# Patient Record
Sex: Male | Born: 1940 | Race: White | Hispanic: No | Marital: Married | Smoking: Former smoker
Health system: Southern US, Community
[De-identification: ages and names within clinical notes are randomized; demographics above are authoritative.]

## PROBLEM LIST (undated history)

## (undated) DIAGNOSIS — D72829 Elevated white blood cell count, unspecified: Secondary | ICD-10-CM

## (undated) DIAGNOSIS — M5430 Sciatica, unspecified side: Secondary | ICD-10-CM

## (undated) DIAGNOSIS — H348392 Tributary (branch) retinal vein occlusion, unspecified eye, stable: Secondary | ICD-10-CM

## (undated) DIAGNOSIS — Z860100 Personal history of colon polyps, unspecified: Secondary | ICD-10-CM

## (undated) DIAGNOSIS — C61 Malignant neoplasm of prostate: Secondary | ICD-10-CM

## (undated) DIAGNOSIS — J449 Chronic obstructive pulmonary disease, unspecified: Secondary | ICD-10-CM

## (undated) DIAGNOSIS — I219 Acute myocardial infarction, unspecified: Secondary | ICD-10-CM

## (undated) DIAGNOSIS — H40059 Ocular hypertension, unspecified eye: Secondary | ICD-10-CM

## (undated) DIAGNOSIS — I10 Essential (primary) hypertension: Secondary | ICD-10-CM

## (undated) DIAGNOSIS — K219 Gastro-esophageal reflux disease without esophagitis: Secondary | ICD-10-CM

## (undated) DIAGNOSIS — M48061 Spinal stenosis, lumbar region without neurogenic claudication: Secondary | ICD-10-CM

## (undated) DIAGNOSIS — S37009A Unspecified injury of unspecified kidney, initial encounter: Secondary | ICD-10-CM

## (undated) DIAGNOSIS — M79661 Pain in right lower leg: Secondary | ICD-10-CM

## (undated) DIAGNOSIS — R911 Solitary pulmonary nodule: Secondary | ICD-10-CM

## (undated) DIAGNOSIS — I2510 Atherosclerotic heart disease of native coronary artery without angina pectoris: Secondary | ICD-10-CM

## (undated) DIAGNOSIS — G2581 Restless legs syndrome: Secondary | ICD-10-CM

## (undated) DIAGNOSIS — E785 Hyperlipidemia, unspecified: Secondary | ICD-10-CM

## (undated) DIAGNOSIS — N419 Inflammatory disease of prostate, unspecified: Secondary | ICD-10-CM

## (undated) HISTORY — PX: COLONOSCOPY W/ BIOPSIES AND POLYPECTOMY: SHX1376

## (undated) HISTORY — PX: PROSTATE BIOPSY: SHX241

## (undated) HISTORY — DX: Personal history of colon polyps, unspecified: Z86.0100

## (undated) HISTORY — DX: Pain in right lower leg: M79.661

## (undated) HISTORY — DX: Restless legs syndrome: G25.81

## (undated) HISTORY — DX: Sciatica, unspecified side: M54.30

## (undated) HISTORY — DX: Tributary (branch) retinal vein occlusion, unspecified eye, stable: H34.8392

## (undated) HISTORY — DX: Elevated white blood cell count, unspecified: D72.829

## (undated) HISTORY — PX: TONSILLECTOMY: SUR1361

## (undated) HISTORY — DX: Malignant neoplasm of prostate: C61

## (undated) HISTORY — DX: Gastro-esophageal reflux disease without esophagitis: K21.9

## (undated) HISTORY — DX: Inflammatory disease of prostate, unspecified: N41.9

## (undated) HISTORY — DX: Spinal stenosis, lumbar region without neurogenic claudication: M48.061

## (undated) HISTORY — DX: Ocular hypertension, unspecified eye: H40.059

## (undated) HISTORY — PX: COLONOSCOPY: SHX174

## (undated) HISTORY — DX: Hyperlipidemia, unspecified: E78.5

## (undated) HISTORY — DX: Unspecified injury of unspecified kidney, initial encounter: S37.009A

## (undated) HISTORY — DX: Personal history of colonic polyps: Z86.010

## (undated) HISTORY — DX: Solitary pulmonary nodule: R91.1

---

## 1999-02-08 ENCOUNTER — Emergency Department (HOSPITAL_COMMUNITY): Admission: EM | Admit: 1999-02-08 | Discharge: 1999-02-08 | Payer: Self-pay | Admitting: *Deleted

## 2000-06-26 ENCOUNTER — Ambulatory Visit (HOSPITAL_BASED_OUTPATIENT_CLINIC_OR_DEPARTMENT_OTHER): Admission: RE | Admit: 2000-06-26 | Discharge: 2000-06-26 | Payer: Self-pay | Admitting: Internal Medicine

## 2000-08-15 ENCOUNTER — Inpatient Hospital Stay (HOSPITAL_COMMUNITY): Admission: EM | Admit: 2000-08-15 | Discharge: 2000-08-16 | Payer: Self-pay | Admitting: Emergency Medicine

## 2000-08-15 ENCOUNTER — Encounter: Payer: Self-pay | Admitting: Emergency Medicine

## 2000-08-16 ENCOUNTER — Encounter: Payer: Self-pay | Admitting: Internal Medicine

## 2002-10-26 ENCOUNTER — Ambulatory Visit (HOSPITAL_COMMUNITY): Admission: RE | Admit: 2002-10-26 | Discharge: 2002-10-26 | Payer: Self-pay | Admitting: Gastroenterology

## 2003-04-25 HISTORY — PX: PROSTATECTOMY: SHX69

## 2003-05-14 ENCOUNTER — Inpatient Hospital Stay (HOSPITAL_COMMUNITY): Admission: RE | Admit: 2003-05-14 | Discharge: 2003-05-17 | Payer: Self-pay | Admitting: Urology

## 2003-09-25 HISTORY — PX: INGUINAL HERNIA REPAIR: SUR1180

## 2003-10-18 ENCOUNTER — Ambulatory Visit (HOSPITAL_BASED_OUTPATIENT_CLINIC_OR_DEPARTMENT_OTHER): Admission: RE | Admit: 2003-10-18 | Discharge: 2003-10-18 | Payer: Self-pay | Admitting: Surgery

## 2003-10-18 ENCOUNTER — Ambulatory Visit (HOSPITAL_COMMUNITY): Admission: RE | Admit: 2003-10-18 | Discharge: 2003-10-18 | Payer: Self-pay | Admitting: Surgery

## 2007-08-25 DIAGNOSIS — H348392 Tributary (branch) retinal vein occlusion, unspecified eye, stable: Secondary | ICD-10-CM

## 2007-08-25 HISTORY — PX: RETINAL LASER PROCEDURE: SHX2339

## 2007-08-25 HISTORY — DX: Tributary (branch) retinal vein occlusion, unspecified eye, stable: H34.8392

## 2011-10-20 DIAGNOSIS — H35379 Puckering of macula, unspecified eye: Secondary | ICD-10-CM | POA: Diagnosis not present

## 2011-10-20 DIAGNOSIS — H40019 Open angle with borderline findings, low risk, unspecified eye: Secondary | ICD-10-CM | POA: Diagnosis not present

## 2011-10-20 DIAGNOSIS — H2510 Age-related nuclear cataract, unspecified eye: Secondary | ICD-10-CM | POA: Diagnosis not present

## 2012-04-19 DIAGNOSIS — H349 Unspecified retinal vascular occlusion: Secondary | ICD-10-CM | POA: Diagnosis not present

## 2012-04-19 DIAGNOSIS — H52209 Unspecified astigmatism, unspecified eye: Secondary | ICD-10-CM | POA: Diagnosis not present

## 2012-04-19 DIAGNOSIS — H2510 Age-related nuclear cataract, unspecified eye: Secondary | ICD-10-CM | POA: Diagnosis not present

## 2012-04-19 DIAGNOSIS — H40019 Open angle with borderline findings, low risk, unspecified eye: Secondary | ICD-10-CM | POA: Diagnosis not present

## 2012-04-20 DIAGNOSIS — G4700 Insomnia, unspecified: Secondary | ICD-10-CM | POA: Diagnosis not present

## 2012-04-20 DIAGNOSIS — E785 Hyperlipidemia, unspecified: Secondary | ICD-10-CM | POA: Diagnosis not present

## 2012-04-20 DIAGNOSIS — Z131 Encounter for screening for diabetes mellitus: Secondary | ICD-10-CM | POA: Diagnosis not present

## 2012-04-20 DIAGNOSIS — F17200 Nicotine dependence, unspecified, uncomplicated: Secondary | ICD-10-CM | POA: Diagnosis not present

## 2012-04-20 DIAGNOSIS — K219 Gastro-esophageal reflux disease without esophagitis: Secondary | ICD-10-CM | POA: Diagnosis not present

## 2012-04-20 DIAGNOSIS — Z8546 Personal history of malignant neoplasm of prostate: Secondary | ICD-10-CM | POA: Diagnosis not present

## 2012-04-20 DIAGNOSIS — Z1331 Encounter for screening for depression: Secondary | ICD-10-CM | POA: Diagnosis not present

## 2012-06-09 DIAGNOSIS — Z23 Encounter for immunization: Secondary | ICD-10-CM | POA: Diagnosis not present

## 2012-08-24 HISTORY — PX: CATARACT EXTRACTION W/ INTRAOCULAR LENS  IMPLANT, BILATERAL: SHX1307

## 2012-10-24 DIAGNOSIS — H2510 Age-related nuclear cataract, unspecified eye: Secondary | ICD-10-CM | POA: Diagnosis not present

## 2012-10-24 DIAGNOSIS — H40019 Open angle with borderline findings, low risk, unspecified eye: Secondary | ICD-10-CM | POA: Diagnosis not present

## 2012-10-24 DIAGNOSIS — H35379 Puckering of macula, unspecified eye: Secondary | ICD-10-CM | POA: Diagnosis not present

## 2012-10-24 DIAGNOSIS — H349 Unspecified retinal vascular occlusion: Secondary | ICD-10-CM | POA: Diagnosis not present

## 2012-11-01 DIAGNOSIS — H2510 Age-related nuclear cataract, unspecified eye: Secondary | ICD-10-CM | POA: Diagnosis not present

## 2012-11-01 DIAGNOSIS — H40019 Open angle with borderline findings, low risk, unspecified eye: Secondary | ICD-10-CM | POA: Diagnosis not present

## 2012-12-06 DIAGNOSIS — H40019 Open angle with borderline findings, low risk, unspecified eye: Secondary | ICD-10-CM | POA: Diagnosis not present

## 2012-12-29 DIAGNOSIS — H35379 Puckering of macula, unspecified eye: Secondary | ICD-10-CM | POA: Diagnosis not present

## 2012-12-29 DIAGNOSIS — H2510 Age-related nuclear cataract, unspecified eye: Secondary | ICD-10-CM | POA: Diagnosis not present

## 2012-12-29 DIAGNOSIS — H25019 Cortical age-related cataract, unspecified eye: Secondary | ICD-10-CM | POA: Diagnosis not present

## 2013-01-04 DIAGNOSIS — H2589 Other age-related cataract: Secondary | ICD-10-CM | POA: Diagnosis not present

## 2013-01-04 DIAGNOSIS — Z8669 Personal history of other diseases of the nervous system and sense organs: Secondary | ICD-10-CM | POA: Diagnosis not present

## 2013-01-04 DIAGNOSIS — H25019 Cortical age-related cataract, unspecified eye: Secondary | ICD-10-CM | POA: Diagnosis not present

## 2013-01-04 DIAGNOSIS — H2510 Age-related nuclear cataract, unspecified eye: Secondary | ICD-10-CM | POA: Diagnosis not present

## 2013-01-18 DIAGNOSIS — H25019 Cortical age-related cataract, unspecified eye: Secondary | ICD-10-CM | POA: Diagnosis not present

## 2013-01-18 DIAGNOSIS — H2589 Other age-related cataract: Secondary | ICD-10-CM | POA: Diagnosis not present

## 2013-01-18 DIAGNOSIS — H2510 Age-related nuclear cataract, unspecified eye: Secondary | ICD-10-CM | POA: Diagnosis not present

## 2013-05-16 DIAGNOSIS — Z1331 Encounter for screening for depression: Secondary | ICD-10-CM | POA: Diagnosis not present

## 2013-05-16 DIAGNOSIS — Z136 Encounter for screening for cardiovascular disorders: Secondary | ICD-10-CM | POA: Diagnosis not present

## 2013-05-16 DIAGNOSIS — G4700 Insomnia, unspecified: Secondary | ICD-10-CM | POA: Diagnosis not present

## 2013-05-16 DIAGNOSIS — Z8546 Personal history of malignant neoplasm of prostate: Secondary | ICD-10-CM | POA: Diagnosis not present

## 2013-05-16 DIAGNOSIS — Z0000 Encounter for general adult medical examination without abnormal findings: Secondary | ICD-10-CM | POA: Diagnosis not present

## 2013-06-09 DIAGNOSIS — E785 Hyperlipidemia, unspecified: Secondary | ICD-10-CM | POA: Diagnosis not present

## 2013-06-09 DIAGNOSIS — Z23 Encounter for immunization: Secondary | ICD-10-CM | POA: Diagnosis not present

## 2013-06-09 DIAGNOSIS — F17200 Nicotine dependence, unspecified, uncomplicated: Secondary | ICD-10-CM | POA: Diagnosis not present

## 2013-06-13 DIAGNOSIS — H40019 Open angle with borderline findings, low risk, unspecified eye: Secondary | ICD-10-CM | POA: Diagnosis not present

## 2013-08-29 DIAGNOSIS — E785 Hyperlipidemia, unspecified: Secondary | ICD-10-CM | POA: Diagnosis not present

## 2013-09-08 DIAGNOSIS — M5430 Sciatica, unspecified side: Secondary | ICD-10-CM | POA: Diagnosis not present

## 2013-11-07 DIAGNOSIS — H40019 Open angle with borderline findings, low risk, unspecified eye: Secondary | ICD-10-CM | POA: Diagnosis not present

## 2014-03-09 DIAGNOSIS — H40019 Open angle with borderline findings, low risk, unspecified eye: Secondary | ICD-10-CM | POA: Diagnosis not present

## 2014-05-17 DIAGNOSIS — E785 Hyperlipidemia, unspecified: Secondary | ICD-10-CM | POA: Diagnosis not present

## 2014-05-17 DIAGNOSIS — Z8546 Personal history of malignant neoplasm of prostate: Secondary | ICD-10-CM | POA: Diagnosis not present

## 2014-05-17 DIAGNOSIS — Z1331 Encounter for screening for depression: Secondary | ICD-10-CM | POA: Diagnosis not present

## 2014-05-17 DIAGNOSIS — Z23 Encounter for immunization: Secondary | ICD-10-CM | POA: Diagnosis not present

## 2014-05-17 DIAGNOSIS — Z0000 Encounter for general adult medical examination without abnormal findings: Secondary | ICD-10-CM | POA: Diagnosis not present

## 2014-08-30 DIAGNOSIS — M5431 Sciatica, right side: Secondary | ICD-10-CM | POA: Diagnosis not present

## 2014-08-30 DIAGNOSIS — J069 Acute upper respiratory infection, unspecified: Secondary | ICD-10-CM | POA: Diagnosis not present

## 2014-10-15 DIAGNOSIS — H349 Unspecified retinal vascular occlusion: Secondary | ICD-10-CM | POA: Diagnosis not present

## 2014-10-15 DIAGNOSIS — Z961 Presence of intraocular lens: Secondary | ICD-10-CM | POA: Diagnosis not present

## 2014-10-15 DIAGNOSIS — H35372 Puckering of macula, left eye: Secondary | ICD-10-CM | POA: Diagnosis not present

## 2014-10-15 DIAGNOSIS — H40013 Open angle with borderline findings, low risk, bilateral: Secondary | ICD-10-CM | POA: Diagnosis not present

## 2015-01-10 ENCOUNTER — Other Ambulatory Visit: Payer: Self-pay | Admitting: Gastroenterology

## 2015-01-15 ENCOUNTER — Encounter: Payer: Self-pay | Admitting: *Deleted

## 2015-04-02 ENCOUNTER — Encounter (HOSPITAL_COMMUNITY): Payer: Self-pay | Admitting: *Deleted

## 2015-04-08 ENCOUNTER — Ambulatory Visit (HOSPITAL_COMMUNITY)
Admission: RE | Admit: 2015-04-08 | Discharge: 2015-04-08 | Disposition: A | Discharge status: Home / Self Care | Payer: Medicare Other | Source: Ambulatory Visit | Attending: Gastroenterology | Admitting: Gastroenterology

## 2015-04-08 ENCOUNTER — Ambulatory Visit (HOSPITAL_COMMUNITY): Payer: Medicare Other | Admitting: Registered Nurse

## 2015-04-08 ENCOUNTER — Encounter (HOSPITAL_COMMUNITY): Payer: Self-pay

## 2015-04-08 ENCOUNTER — Encounter (HOSPITAL_COMMUNITY)
Admission: RE | Disposition: A | Discharge status: Home / Self Care | Payer: Self-pay | Source: Ambulatory Visit | Attending: Gastroenterology

## 2015-04-08 DIAGNOSIS — K5790 Diverticulosis of intestine, part unspecified, without perforation or abscess without bleeding: Secondary | ICD-10-CM | POA: Diagnosis not present

## 2015-04-08 DIAGNOSIS — K5730 Diverticulosis of large intestine without perforation or abscess without bleeding: Secondary | ICD-10-CM | POA: Diagnosis not present

## 2015-04-08 DIAGNOSIS — Z1211 Encounter for screening for malignant neoplasm of colon: Secondary | ICD-10-CM | POA: Diagnosis not present

## 2015-04-08 DIAGNOSIS — E780 Pure hypercholesterolemia: Secondary | ICD-10-CM | POA: Insufficient documentation

## 2015-04-08 DIAGNOSIS — Z86010 Personal history of colonic polyps: Secondary | ICD-10-CM | POA: Insufficient documentation

## 2015-04-08 DIAGNOSIS — Z8546 Personal history of malignant neoplasm of prostate: Secondary | ICD-10-CM | POA: Insufficient documentation

## 2015-04-08 DIAGNOSIS — G2581 Restless legs syndrome: Secondary | ICD-10-CM | POA: Diagnosis not present

## 2015-04-08 DIAGNOSIS — F17200 Nicotine dependence, unspecified, uncomplicated: Secondary | ICD-10-CM | POA: Insufficient documentation

## 2015-04-08 DIAGNOSIS — K219 Gastro-esophageal reflux disease without esophagitis: Secondary | ICD-10-CM | POA: Diagnosis not present

## 2015-04-08 HISTORY — PX: COLONOSCOPY WITH PROPOFOL: SHX5780

## 2015-04-08 SURGERY — COLONOSCOPY WITH PROPOFOL
Anesthesia: Monitor Anesthesia Care

## 2015-04-08 MED ORDER — PROPOFOL INFUSION 10 MG/ML OPTIME
INTRAVENOUS | Status: DC | PRN
  Administered 2015-04-08: 120 ug/kg/min via INTRAVENOUS

## 2015-04-08 MED ORDER — LIDOCAINE HCL (CARDIAC) 20 MG/ML IV SOLN
INTRAVENOUS | Status: DC | PRN
  Administered 2015-04-08: 100 mg via INTRAVENOUS

## 2015-04-08 MED ORDER — LACTATED RINGERS IV SOLN
INTRAVENOUS | Status: DC
  Administered 2015-04-08: 11:00:00 via INTRAVENOUS

## 2015-04-08 MED ORDER — LIDOCAINE HCL (CARDIAC) 20 MG/ML IV SOLN
INTRAVENOUS | Status: AC
  Filled 2015-04-08: qty 5

## 2015-04-08 MED ORDER — PROPOFOL 10 MG/ML IV BOLUS
INTRAVENOUS | Status: AC
  Filled 2015-04-08: qty 20

## 2015-04-08 MED ORDER — SODIUM CHLORIDE 0.9 % IV SOLN: INTRAVENOUS | Status: DC

## 2015-04-08 MED ORDER — PROPOFOL 10 MG/ML IV BOLUS
INTRAVENOUS | Status: DC | PRN
  Administered 2015-04-08: 40 mg via INTRAVENOUS
  Administered 2015-04-08 (×2): 30 mg via INTRAVENOUS

## 2015-04-08 SURGICAL SUPPLY — 21 items

## 2015-04-08 NOTE — Transfer of Care (Signed)
Immediate Anesthesia Transfer of Care Note  Patient: Jay Wilkins  Procedure(s) Performed: Procedure(s): COLONOSCOPY WITH PROPOFOL (N/A)  Patient Location: PACU and Endoscopy Unit  Anesthesia Type:MAC  Level of Consciousness: awake, alert , oriented and patient cooperative  Airway & Oxygen Therapy: Patient Spontanous Breathing and Patient connected to face mask oxygen  Post-op Assessment: Report given to RN, Post -op Vital signs reviewed and stable and Patient moving all extremities  Post vital signs: Reviewed and stable  Last Vitals:  Filed Vitals:   04/08/15 1023  BP: 180/96  Pulse: 78  Temp: 36.4 C  Resp: 14    Complications: No apparent anesthesia complications

## 2015-04-08 NOTE — Anesthesia Procedure Notes (Signed)
Procedure Name: MAC Date/Time: 04/08/2015 10:36 AM Performed by: Carleene Cooper A Pre-anesthesia Checklist: Patient identified, Timeout performed, Emergency Drugs available, Suction available and Patient being monitored Patient Re-evaluated:Patient Re-evaluated prior to inductionOxygen Delivery Method: Simple face mask Dental Injury: Teeth and Oropharynx as per pre-operative assessment

## 2015-04-08 NOTE — Anesthesia Postprocedure Evaluation (Signed)
  Anesthesia Post-op Note  Patient: Jay Wilkins  Procedure(s) Performed: Procedure(s) (LRB): COLONOSCOPY WITH PROPOFOL (N/A)  Patient Location: PACU  Anesthesia Type: MAC  Level of Consciousness: awake and alert   Airway and Oxygen Therapy: Patient Spontanous Breathing  Post-op Pain: mild  Post-op Assessment: Post-op Vital signs reviewed, Patient's Cardiovascular Status Stable, Respiratory Function Stable, Patent Airway and No signs of Nausea or vomiting  Last Vitals:  Filed Vitals:   04/08/15 1125  BP: 134/79  Pulse: 69  Temp:   Resp: 18    Post-op Vital Signs: stable   Complications: No apparent anesthesia complications

## 2015-04-08 NOTE — Anesthesia Preprocedure Evaluation (Addendum)
Anesthesia Evaluation  Patient identified by MRN, date of birth, ID band Patient awake    Reviewed: Allergy & Precautions, NPO status , Patient's Chart, lab work & pertinent test results  Airway Mallampati: II  TM Distance: >3 FB Neck ROM: Full    Dental no notable dental hx.    Pulmonary Current Smoker,  breath sounds clear to auscultation  Pulmonary exam normal       Cardiovascular negative cardio ROS Normal cardiovascular examRhythm:Regular Rate:Normal     Neuro/Psych negative neurological ROS  negative psych ROS   GI/Hepatic negative GI ROS, Neg liver ROS,   Endo/Other  negative endocrine ROS  Renal/GU negative Renal ROS  negative genitourinary   Musculoskeletal negative musculoskeletal ROS (+)   Abdominal   Peds negative pediatric ROS (+)  Hematology negative hematology ROS (+)   Anesthesia Other Findings   Reproductive/Obstetrics negative OB ROS                            Anesthesia Physical Anesthesia Plan  ASA: II  Anesthesia Plan: MAC   Post-op Pain Management:    Induction:   Airway Management Planned: Simple Face Mask and Natural Airway  Additional Equipment:   Intra-op Plan:   Post-operative Plan:   Informed Consent: I have reviewed the patients History and Physical, chart, labs and discussed the procedure including the risks, benefits and alternatives for the proposed anesthesia with the patient or authorized representative who has indicated his/her understanding and acceptance.   Dental advisory given  Plan Discussed with: CRNA  Anesthesia Plan Comments:        Anesthesia Quick Evaluation

## 2015-04-08 NOTE — H&P (Signed)
  Procedure: Surveillance colonoscopy. 1991 colonoscopy performed with removal of a 5 mm adenomatous colon polyp. Normal surveillance colonoscopy performed on 04/10/2008  History: The patient is a 74 year old male born [birth year]. He is scheduled to undergo a surveillance colonoscopy today.  Past medical history: Gastroesophageal reflux. Hypercholesterolemia. Prostate cancer. Prostatectomy. Restless leg syndrome. Tonsillectomy. Right inguinal hernia are clear. Cataract surgery.  Medication allergies: None  Exam: The patient is alert and lying comfortably on the endoscopy stretcher. Abdomen is soft and nontender to palpation. Lungs are clear to auscultation. Cardiac exam reveals a regular rhythm.  Plan: Proceed with surveillance colonoscopy

## 2015-04-08 NOTE — Op Note (Signed)
Procedure: Surveillance colonoscopy. Adenomatous colon polyps removed colonoscopically in the past  Endoscopist: Earle Gell  Premedication: Propofol administered by anesthesia  Procedure: The patient was placed in the left lateral decubitus position. Anal inspection and digital rectal exam were normal. The Pentax pediatric colonoscope was introduced into the rectum and advanced to the cecum. A normal-appearing appendiceal orifice and ileocecal valve were identified. Colonic preparation for the exam today was good. Withdrawal time was 11 minutes  Rectum. Normal. Retroflexed view of the distal rectum was normal  Sigmoid colon and descending colon. Left colonic diverticulosis  Splenic flexure. Normal  Transverse colon. Normal.  Hepatic flexure. Normal  Ascending colon. Normal  Cecum and ileocecal valve. Normal  Assessment: Normal surveillance colonoscopy

## 2015-04-08 NOTE — Discharge Instructions (Signed)
Colonoscopy, Care After °These instructions give you information on caring for yourself after your procedure. Your doctor may also give you more specific instructions. Call your doctor if you have any problems or questions after your procedure. °HOME CARE °· Do not drive for 24 hours. °· Do not sign important papers or use machinery for 24 hours. °· You may shower. °· You may go back to your usual activities, but go slower for the first 24 hours. °· Take rest breaks often during the first 24 hours. °· Walk around or use warm packs on your belly (abdomen) if you have belly cramping or gas. °· Drink enough fluids to keep your pee (urine) clear or pale yellow. °· Resume your normal diet. Avoid heavy or fried foods. °· Avoid drinking alcohol for 24 hours or as told by your doctor. °· Only take medicines as told by your doctor. °If a tissue sample (biopsy) was taken during the procedure:  °· Do not take aspirin or blood thinners for 7 days, or as told by your doctor. °· Do not drink alcohol for 7 days, or as told by your doctor. °· Eat soft foods for the first 24 hours. °GET HELP IF: °You still have a small amount of blood in your poop (stool) 2-3 days after the procedure. °GET HELP RIGHT AWAY IF: °· You have more than a small amount of blood in your poop. °· You see clumps of tissue (blood clots) in your poop. °· Your belly is puffy (swollen). °· You feel sick to your stomach (nauseous) or throw up (vomit). °· You have a fever. °· You have belly pain that gets worse and medicine does not help. °MAKE SURE YOU: °· Understand these instructions. °· Will watch your condition. °· Will get help right away if you are not doing well or get worse. °Document Released: 09/12/2010 Document Revised: 08/15/2013 Document Reviewed: 04/17/2013 °ExitCare® Patient Information ©2015 [employer]. This information is not intended to replace advice given to you by your health care provider. Make sure you discuss any questions you have with  your health care provider. ° °

## 2015-04-09 ENCOUNTER — Encounter (HOSPITAL_COMMUNITY): Payer: Self-pay | Admitting: Gastroenterology

## 2015-04-16 DIAGNOSIS — H40013 Open angle with borderline findings, low risk, bilateral: Secondary | ICD-10-CM | POA: Diagnosis not present

## 2015-04-16 DIAGNOSIS — H35372 Puckering of macula, left eye: Secondary | ICD-10-CM | POA: Diagnosis not present

## 2015-05-14 DIAGNOSIS — Z8546 Personal history of malignant neoplasm of prostate: Secondary | ICD-10-CM | POA: Diagnosis not present

## 2015-05-14 DIAGNOSIS — Z125 Encounter for screening for malignant neoplasm of prostate: Secondary | ICD-10-CM | POA: Diagnosis not present

## 2015-05-14 DIAGNOSIS — K219 Gastro-esophageal reflux disease without esophagitis: Secondary | ICD-10-CM | POA: Diagnosis not present

## 2015-05-14 DIAGNOSIS — E780 Pure hypercholesterolemia: Secondary | ICD-10-CM | POA: Diagnosis not present

## 2015-05-14 DIAGNOSIS — Z23 Encounter for immunization: Secondary | ICD-10-CM | POA: Diagnosis not present

## 2015-05-14 DIAGNOSIS — Z720 Tobacco use: Secondary | ICD-10-CM | POA: Diagnosis not present

## 2015-05-14 DIAGNOSIS — Z1389 Encounter for screening for other disorder: Secondary | ICD-10-CM | POA: Diagnosis not present

## 2015-05-28 DIAGNOSIS — H43813 Vitreous degeneration, bilateral: Secondary | ICD-10-CM | POA: Diagnosis not present

## 2015-05-28 DIAGNOSIS — H35373 Puckering of macula, bilateral: Secondary | ICD-10-CM | POA: Diagnosis not present

## 2015-05-28 DIAGNOSIS — H3581 Retinal edema: Secondary | ICD-10-CM | POA: Diagnosis not present

## 2015-05-28 DIAGNOSIS — H348322 Tributary (branch) retinal vein occlusion, left eye, stable: Secondary | ICD-10-CM | POA: Diagnosis not present

## 2015-07-23 ENCOUNTER — Other Ambulatory Visit: Payer: Self-pay | Admitting: Internal Medicine

## 2015-07-23 DIAGNOSIS — F17210 Nicotine dependence, cigarettes, uncomplicated: Secondary | ICD-10-CM

## 2015-07-23 DIAGNOSIS — R05 Cough: Secondary | ICD-10-CM | POA: Diagnosis not present

## 2015-07-23 DIAGNOSIS — Z139 Encounter for screening, unspecified: Secondary | ICD-10-CM

## 2015-08-01 ENCOUNTER — Ambulatory Visit
Admission: RE | Admit: 2015-08-01 | Discharge: 2015-08-01 | Disposition: A | Discharge status: Home / Self Care | Payer: Medicare Other | Source: Ambulatory Visit | Attending: Internal Medicine | Admitting: Internal Medicine

## 2015-08-01 DIAGNOSIS — Z87891 Personal history of nicotine dependence: Secondary | ICD-10-CM | POA: Diagnosis not present

## 2015-08-01 DIAGNOSIS — F17210 Nicotine dependence, cigarettes, uncomplicated: Secondary | ICD-10-CM

## 2015-08-01 DIAGNOSIS — Z139 Encounter for screening, unspecified: Secondary | ICD-10-CM

## 2015-08-12 DIAGNOSIS — F17210 Nicotine dependence, cigarettes, uncomplicated: Secondary | ICD-10-CM | POA: Diagnosis not present

## 2015-08-29 DIAGNOSIS — E7800 Pure hypercholesterolemia, unspecified: Secondary | ICD-10-CM | POA: Diagnosis not present

## 2015-08-29 DIAGNOSIS — I2510 Atherosclerotic heart disease of native coronary artery without angina pectoris: Secondary | ICD-10-CM | POA: Diagnosis not present

## 2015-08-29 DIAGNOSIS — Z720 Tobacco use: Secondary | ICD-10-CM | POA: Diagnosis not present

## 2015-08-29 DIAGNOSIS — J449 Chronic obstructive pulmonary disease, unspecified: Secondary | ICD-10-CM | POA: Diagnosis not present

## 2015-08-30 ENCOUNTER — Other Ambulatory Visit (HOSPITAL_COMMUNITY): Payer: Self-pay | Admitting: Internal Medicine

## 2015-08-30 DIAGNOSIS — I2510 Atherosclerotic heart disease of native coronary artery without angina pectoris: Secondary | ICD-10-CM

## 2015-09-02 ENCOUNTER — Telehealth (HOSPITAL_COMMUNITY): Payer: Self-pay | Admitting: *Deleted

## 2015-09-02 NOTE — Telephone Encounter (Signed)
Patient given detailed instructions per Myocardial Perfusion Study Information Sheet for the test on 09/03/15 at 7:30. Patient notified to arrive 15 minutes early and that it is imperative to arrive on time for appointment to keep from having the test rescheduled.  If you need to cancel or reschedule your appointment, please call the office within 24 hours of your appointment. Failure to do so may result in a cancellation of your appointment, and a $50 no show fee. Patient verbalized understanding.Veronia Beets

## 2015-09-03 ENCOUNTER — Ambulatory Visit (HOSPITAL_COMMUNITY): Payer: Medicare Other | Attending: Cardiology

## 2015-09-03 DIAGNOSIS — I2510 Atherosclerotic heart disease of native coronary artery without angina pectoris: Secondary | ICD-10-CM

## 2015-09-03 DIAGNOSIS — R9439 Abnormal result of other cardiovascular function study: Secondary | ICD-10-CM | POA: Diagnosis not present

## 2015-09-03 LAB — MYOCARDIAL PERFUSION IMAGING
Estimated workload: 10.1 METS
Exercise duration (min): 8 min
Exercise duration (sec): 0 s
LV dias vol: 131 mL
LV sys vol: 74 mL
MPHR: 146 {beats}/min
Peak HR: 142 {beats}/min
Percent HR: 97 %
RATE: 0.3
RPE: 18
Rest BP: 146 mmHg
Rest HR: 68 {beats}/min
SDS: 4
SRS: 11
SSS: 15
TID: 1

## 2015-09-03 MED ORDER — TECHNETIUM TC 99M SESTAMIBI GENERIC - CARDIOLITE
32.4000 | Freq: Once | INTRAVENOUS | Status: AC | PRN
  Administered 2015-09-03: 32 via INTRAVENOUS

## 2015-09-03 MED ORDER — TECHNETIUM TC 99M SESTAMIBI GENERIC - CARDIOLITE
10.6000 | Freq: Once | INTRAVENOUS | Status: AC | PRN
  Administered 2015-09-03: 11 via INTRAVENOUS

## 2015-10-07 ENCOUNTER — Encounter: Payer: Self-pay | Admitting: Cardiovascular Disease

## 2015-10-07 ENCOUNTER — Encounter: Payer: Self-pay | Admitting: Nurse Practitioner

## 2015-10-07 ENCOUNTER — Ambulatory Visit (INDEPENDENT_AMBULATORY_CARE_PROVIDER_SITE_OTHER): Payer: Medicare Other | Admitting: Cardiovascular Disease

## 2015-10-07 VITALS — BP 132/76 | HR 71 | Ht 68.0 in | Wt 159.4 lb

## 2015-10-07 DIAGNOSIS — I2510 Atherosclerotic heart disease of native coronary artery without angina pectoris: Secondary | ICD-10-CM

## 2015-10-07 DIAGNOSIS — E785 Hyperlipidemia, unspecified: Secondary | ICD-10-CM

## 2015-10-07 DIAGNOSIS — I25119 Atherosclerotic heart disease of native coronary artery with unspecified angina pectoris: Secondary | ICD-10-CM | POA: Diagnosis not present

## 2015-10-07 DIAGNOSIS — Z9861 Coronary angioplasty status: Secondary | ICD-10-CM

## 2015-10-07 LAB — CBC WITH DIFFERENTIAL/PLATELET
Basophils Absolute: 0 10*3/uL (ref 0.0–0.1)
Basophils Relative: 0 % (ref 0–1)
Eosinophils Absolute: 6.4 10*3/uL — ABNORMAL HIGH (ref 0.0–0.7)
Eosinophils Relative: 45 % — ABNORMAL HIGH (ref 0–5)
HCT: 41 % (ref 39.0–52.0)
Hemoglobin: 13.4 g/dL (ref 13.0–17.0)
Lymphocytes Relative: 17 % (ref 12–46)
Lymphs Abs: 2.4 10*3/uL (ref 0.7–4.0)
MCH: 32.4 pg (ref 26.0–34.0)
MCHC: 32.7 g/dL (ref 30.0–36.0)
MCV: 99.3 fL (ref 78.0–100.0)
MPV: 10.2 fL (ref 8.6–12.4)
Monocytes Absolute: 0.7 10*3/uL (ref 0.1–1.0)
Monocytes Relative: 5 % (ref 3–12)
Neutro Abs: 4.7 10*3/uL (ref 1.7–7.7)
Neutrophils Relative %: 33 % — ABNORMAL LOW (ref 43–77)
Platelets: 251 10*3/uL (ref 150–400)
RBC: 4.13 MIL/uL — ABNORMAL LOW (ref 4.22–5.81)
RDW: 15.1 % (ref 11.5–15.5)
WBC: 14.2 10*3/uL — ABNORMAL HIGH (ref 4.0–10.5)

## 2015-10-07 LAB — BASIC METABOLIC PANEL
BUN: 21 mg/dL (ref 7–25)
CO2: 24 mmol/L (ref 20–31)
Calcium: 8.8 mg/dL (ref 8.6–10.3)
Chloride: 102 mmol/L (ref 98–110)
Creat: 0.92 mg/dL (ref 0.70–1.18)
Glucose, Bld: 77 mg/dL (ref 65–99)
Potassium: 3.9 mmol/L (ref 3.5–5.3)
Sodium: 138 mmol/L (ref 135–146)

## 2015-10-07 LAB — PROTIME-INR
INR: 1.05 (ref <1.50)
Prothrombin Time: 13.8 seconds (ref 11.6–15.2)

## 2015-10-07 MED ORDER — CARVEDILOL 3.125 MG PO TABS: 3.1250 mg | ORAL_TABLET | Freq: Two times a day (BID) | ORAL | Status: DC

## 2015-10-07 NOTE — Patient Instructions (Signed)
Medication Instructions:  START Carvedilol (Coreg) 3.125 mg twice daily - take this medication 12 hours apart   Labwork: TODAY - pt/inr, CBC, basic metabolic panel   Testing/Procedures: Your physician has requested that you have a cardiac catheterization. Cardiac catheterization is used to diagnose and/or treat various heart conditions. Doctors may recommend this procedure for a number of different reasons. The most common reason is to evaluate chest pain. Chest pain can be a symptom of coronary artery disease (CAD), and cardiac catheterization can show whether plaque is narrowing or blocking your heart's arteries. This procedure is also used to evaluate the valves, as well as measure the blood flow and oxygen levels in different parts of your heart. For further information please visit [email or web address]. Please follow instruction sheet, as given.   Follow-Up: Your physician recommends that you schedule a follow-up appointment in: 4 weeks with Dr. Acie Fredrickson   If you need a refill on your cardiac medications before your next appointment, please call your pharmacy.   Thank you for choosing [facility]! Christen Bame, RN [phone]

## 2015-10-07 NOTE — [facility] Notes (Signed)
Cardiology Office Note   Date:  10/07/2015   ID:  Jay Wilkins, DOB [birth year], MRN [id number]  PCP:  Irven Shelling, MD  Cardiologist:   Thayer Headings, MD   Chief Complaint  Patient presents with  . 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 CAD    History of Present Illness: Jay Wilkins is a 75 y.o. male who presents for follow up for CAD  He had a chest CT several months ago .   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  He had no dyspnea or angina during the treadmill . (stopped due to completion)  The CT also showed COPD.   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 Laryngopharyngeal reflux   . GERD (gastroesophageal reflux disease)   . History of colonic polyps   . HLD (hyperlipidemia)   . RLS (restless legs syndrome)   . Increased intraocular pressure   . Prostatitis   . Branch retinal vein occlusion 2009  . Sciatica   . Kidney trauma 1986  . Prostate cancer ([place])     Stage T1 C,    Past Surgical History  Procedure Laterality Date  . Tonsillectomy    . Prostatectomy    . Inguinal hernia repair Right 2005  . Brvo surgery  2009  . Cataract extraction  2014  . Colonoscopy with propofol N/A 04/08/2015    Procedure: COLONOSCOPY WITH PROPOFOL;  Surgeon: Garlan Fair, MD;  Location: [facility];  Service: Endoscopy;  Laterality: N/A;     Current Outpatient Prescriptions  Medication Sig Dispense Refill  . aspirin EC 81 MG tablet Take 81 mg by mouth every morning.    Marland Kitchen atorvastatin (LIPITOR) 10 MG tablet Take  10 mg by mouth every morning.    . cyclobenzaprine (FLEXERIL) 10 MG tablet Take 10 mg by mouth 3 (three) times daily as needed for muscle spasms.    Marland Kitchen esomeprazole (NEXIUM) 40 MG capsule Take 40 mg by mouth as needed (as needed for heartburn or indigestion).     Marland Kitchen ibuprofen (ADVIL,MOTRIN) 200 MG tablet Take 600 mg by mouth every 6 (six) hours as needed for moderate pain.    Marland Kitchen latanoprost (XALATAN) 0.005 % ophthalmic solution Place 1 drop into both eyes at bedtime.    . Multiple Vitamins-Minerals (CENTRUM SILVER PO) Take 1 tablet by mouth daily.    . sodium chloride (OCEAN) 0.65 % SOLN nasal spray Place 1 spray into both nostrils daily as needed for congestion.     No current facility-administered medications for this visit.    Allergies:   Review of patient's allergies indicates no known allergies.    Social History:  The patient  reports that he has been smoking Cigarettes.  He has never used smokeless tobacco. He reports that he drinks alcohol. He reports that he does not use illicit drugs.   Family History:  The patient's family history includes Alcoholism in his sister; CAD  in his mother; Heart attack in his brother, father, and mother; Peripheral vascular disease in his mother.    ROS:  Please see the history of present illness.    Review of Systems: Constitutional:  denies fever, chills, diaphoresis, appetite change and fatigue.  HEENT: denies photophobia, eye pain, redness, hearing loss, ear pain, congestion, sore throat, rhinorrhea, sneezing, neck pain, neck stiffness and tinnitus.  Respiratory: denies SOB, DOE, cough, chest tightness, and wheezing.  Cardiovascular: denies chest pain, palpitations and leg swelling.  Gastrointestinal: denies nausea, vomiting, abdominal pain, diarrhea, constipation, blood in stool.  Genitourinary: denies dysuria, urgency, frequency, hematuria, flank pain and difficulty urinating.  Musculoskeletal: denies  myalgias, back pain, joint swelling,  arthralgias and gait problem.   Skin: denies pallor, rash and wound.  Neurological: denies dizziness, seizures, syncope, weakness, light-headedness, numbness and headaches.   Hematological: denies adenopathy, easy bruising, personal or family bleeding history.  Psychiatric/ Behavioral: denies suicidal ideation, mood changes, confusion, nervousness, sleep disturbance and agitation.       All other systems are reviewed and negative.    PHYSICAL EXAM: VS:  BP 132/76 mmHg  Pulse 71  Ht 5\' 8"  (1.727 m)  Wt 159 lb 6.4 oz (72.303 kg)  BMI 24.24 kg/m2 , BMI Body mass index is 24.24 kg/(m^2). GEN: Well nourished, well developed, in no acute distress HEENT: normal Neck: no JVD, carotid bruits, or masses Cardiac: RRR; no murmurs, rubs, or gallops,no edema  Respiratory:  clear to auscultation bilaterally, normal work of breathing GI: soft, nontender, nondistended, + BS MS: no deformity or atrophy Skin: warm and dry, no rash Neuro:  Strength and sensation are intact Psych: normal   EKG:  EKG is ordered today. The ekg ordered today demonstrates  NSR at 71,  Normal ECG   Recent Labs: No results found for requested labs within last 365 days.    Lipid Panel No results found for: CHOL, TRIG, HDL, CHOLHDL, VLDL, LDLCALC, LDLDIRECT    Wt Readings from Last 3 Encounters:  10/07/15 159 lb 6.4 oz (72.303 kg)  09/03/15 150 lb (68.04 kg)  04/08/15 150 lb (68.04 kg)      Other studies Reviewed: Additional studies/ records that were reviewed today include: . Review of the above records demonstrates:    ASSESSMENT AND PLAN:  1.  CAD -  Pt had a CT of the chest - was found to have extensive coronary ary calcification of the LM and all 3 coronary arteries.   A strss myoview showed  previous Inf. Wall.  Will need a Cath -  Have scheduled it for Thursday  Started Coreg 3.125 BID Already on asa Pre cath labs Will see me in 3-4 weeks.   Current medicines are reviewed at length with the  patient today.  The patient does not have concerns regarding medicines.  The following changes have been made:  no change  Labs/ tests ordered today include:  No orders of the defined types were placed in this encounter.     Disposition:   FU with me in several weeks      Courtney Fenlon, Wonda Cheng, MD  10/07/2015 3:47 PM    [facility] [place], [place], [place]  [address] Phone: [phone]; Fax: [phone]   [facility]  [address] [place], [place]  [address] [phone]   Fax [phone]

## 2015-10-08 LAB — PATHOLOGIST SMEAR REVIEW

## 2015-10-10 ENCOUNTER — Encounter (HOSPITAL_COMMUNITY)
Admission: RE | Disposition: A | Discharge status: Home / Self Care | Payer: Self-pay | Source: Ambulatory Visit | Attending: Cardiology

## 2015-10-10 ENCOUNTER — Encounter (HOSPITAL_COMMUNITY): Payer: Self-pay | Admitting: General Practice

## 2015-10-10 ENCOUNTER — Ambulatory Visit (HOSPITAL_COMMUNITY)
Admission: RE | Admit: 2015-10-10 | Discharge: 2015-10-11 | Disposition: A | Discharge status: Home / Self Care | Payer: Medicare Other | Source: Ambulatory Visit | Attending: Cardiology | Admitting: Cardiology

## 2015-10-10 DIAGNOSIS — I10 Essential (primary) hypertension: Secondary | ICD-10-CM | POA: Insufficient documentation

## 2015-10-10 DIAGNOSIS — R0609 Other forms of dyspnea: Secondary | ICD-10-CM

## 2015-10-10 DIAGNOSIS — Z87891 Personal history of nicotine dependence: Secondary | ICD-10-CM | POA: Insufficient documentation

## 2015-10-10 DIAGNOSIS — I2510 Atherosclerotic heart disease of native coronary artery without angina pectoris: Secondary | ICD-10-CM | POA: Diagnosis not present

## 2015-10-10 DIAGNOSIS — Z8546 Personal history of malignant neoplasm of prostate: Secondary | ICD-10-CM | POA: Diagnosis not present

## 2015-10-10 DIAGNOSIS — J439 Emphysema, unspecified: Secondary | ICD-10-CM | POA: Diagnosis present

## 2015-10-10 DIAGNOSIS — Z9861 Coronary angioplasty status: Secondary | ICD-10-CM

## 2015-10-10 DIAGNOSIS — I255 Ischemic cardiomyopathy: Secondary | ICD-10-CM | POA: Insufficient documentation

## 2015-10-10 DIAGNOSIS — J449 Chronic obstructive pulmonary disease, unspecified: Secondary | ICD-10-CM | POA: Diagnosis not present

## 2015-10-10 DIAGNOSIS — K219 Gastro-esophageal reflux disease without esophagitis: Secondary | ICD-10-CM | POA: Diagnosis not present

## 2015-10-10 DIAGNOSIS — I252 Old myocardial infarction: Secondary | ICD-10-CM | POA: Diagnosis not present

## 2015-10-10 DIAGNOSIS — Z7982 Long term (current) use of aspirin: Secondary | ICD-10-CM | POA: Insufficient documentation

## 2015-10-10 DIAGNOSIS — R9439 Abnormal result of other cardiovascular function study: Secondary | ICD-10-CM | POA: Diagnosis present

## 2015-10-10 DIAGNOSIS — E785 Hyperlipidemia, unspecified: Secondary | ICD-10-CM | POA: Insufficient documentation

## 2015-10-10 DIAGNOSIS — I25119 Atherosclerotic heart disease of native coronary artery with unspecified angina pectoris: Secondary | ICD-10-CM

## 2015-10-10 DIAGNOSIS — R0600 Dyspnea, unspecified: Secondary | ICD-10-CM | POA: Diagnosis present

## 2015-10-10 HISTORY — PX: CARDIAC CATHETERIZATION: SHX172

## 2015-10-10 HISTORY — PX: CORONARY ANGIOPLASTY WITH STENT PLACEMENT: SHX49

## 2015-10-10 HISTORY — DX: Essential (primary) hypertension: I10

## 2015-10-10 HISTORY — DX: Acute myocardial infarction, unspecified: I21.9

## 2015-10-10 HISTORY — DX: Chronic obstructive pulmonary disease, unspecified: J44.9

## 2015-10-10 LAB — POCT ACTIVATED CLOTTING TIME: Activated Clotting Time: 245 seconds

## 2015-10-10 SURGERY — LEFT HEART CATH AND CORONARY ANGIOGRAPHY

## 2015-10-10 MED ORDER — CARVEDILOL 3.125 MG PO TABS
3.1250 mg | ORAL_TABLET | Freq: Two times a day (BID) | ORAL | Status: DC
  Administered 2015-10-10 – 2015-10-11 (×2): 3.125 mg via ORAL
  Filled 2015-10-10 (×2): qty 1

## 2015-10-10 MED ORDER — CLOPIDOGREL BISULFATE 300 MG PO TABS
ORAL_TABLET | ORAL | Status: AC
  Filled 2015-10-10: qty 1

## 2015-10-10 MED ORDER — CLOPIDOGREL BISULFATE 300 MG PO TABS
ORAL_TABLET | ORAL | Status: DC | PRN
  Administered 2015-10-10: 600 mg via ORAL

## 2015-10-10 MED ORDER — SODIUM CHLORIDE 0.9 % IV SOLN
250.0000 mL | INTRAVENOUS | Status: DC | PRN
Start: 2015-10-10 — End: 2015-10-11

## 2015-10-10 MED ORDER — LIDOCAINE HCL (PF) 1 % IJ SOLN
INTRAMUSCULAR | Status: DC | PRN
  Administered 2015-10-10: 2 mL

## 2015-10-10 MED ORDER — ANGIOPLASTY BOOK
Freq: Once | Status: AC
  Administered 2015-10-10: 20:00:00
  Filled 2015-10-10: qty 1

## 2015-10-10 MED ORDER — SODIUM CHLORIDE 0.9 % WEIGHT BASED INFUSION: 1.0000 mL/kg/h | INTRAVENOUS | Status: DC

## 2015-10-10 MED ORDER — ADULT MULTIVITAMIN W/MINERALS CH
ORAL_TABLET | Freq: Every day | ORAL | Status: DC
  Administered 2015-10-10: 1 via ORAL
  Filled 2015-10-10 (×2): qty 1

## 2015-10-10 MED ORDER — VERAPAMIL HCL 2.5 MG/ML IV SOLN
INTRAVENOUS | Status: AC
  Filled 2015-10-10: qty 2

## 2015-10-10 MED ORDER — ASPIRIN EC 81 MG PO TBEC
81.0000 mg | DELAYED_RELEASE_TABLET | Freq: Every morning | ORAL | Status: DC
  Administered 2015-10-11: 81 mg via ORAL
  Filled 2015-10-10: qty 1

## 2015-10-10 MED ORDER — NITROGLYCERIN 1 MG/10 ML FOR IR/CATH LAB
INTRA_ARTERIAL | Status: DC | PRN
  Administered 2015-10-10: 200 ug via INTRACORONARY

## 2015-10-10 MED ORDER — MIDAZOLAM HCL 2 MG/2ML IJ SOLN
INTRAMUSCULAR | Status: DC | PRN
  Administered 2015-10-10 (×2): 1 mg via INTRAVENOUS

## 2015-10-10 MED ORDER — FENTANYL CITRATE (PF) 100 MCG/2ML IJ SOLN
INTRAMUSCULAR | Status: AC
  Filled 2015-10-10: qty 2

## 2015-10-10 MED ORDER — ACETAMINOPHEN 325 MG PO TABS: 650.0000 mg | ORAL_TABLET | ORAL | Status: DC | PRN

## 2015-10-10 MED ORDER — SODIUM CHLORIDE 0.9 % WEIGHT BASED INFUSION: 3.0000 mL/kg/h | INTRAVENOUS | Status: AC

## 2015-10-10 MED ORDER — SODIUM CHLORIDE 0.9 % WEIGHT BASED INFUSION
3.0000 mL/kg/h | INTRAVENOUS | Status: AC
  Administered 2015-10-10: 3 mL/kg/h via INTRAVENOUS

## 2015-10-10 MED ORDER — HEPARIN (PORCINE) IN NACL 2-0.9 UNIT/ML-% IJ SOLN
INTRAMUSCULAR | Status: AC
  Filled 2015-10-10: qty 1000

## 2015-10-10 MED ORDER — ONDANSETRON HCL 4 MG/2ML IJ SOLN: 4.0000 mg | Freq: Four times a day (QID) | INTRAMUSCULAR | Status: DC | PRN

## 2015-10-10 MED ORDER — SODIUM CHLORIDE 0.9 % IV SOLN: 250.0000 mL | INTRAVENOUS | Status: DC | PRN

## 2015-10-10 MED ORDER — NITROGLYCERIN 1 MG/10 ML FOR IR/CATH LAB
INTRA_ARTERIAL | Status: AC
  Filled 2015-10-10: qty 10

## 2015-10-10 MED ORDER — IOHEXOL 350 MG/ML SOLN
INTRAVENOUS | Status: DC | PRN
  Administered 2015-10-10: 120 mL via INTRA_ARTERIAL

## 2015-10-10 MED ORDER — HEPARIN SODIUM (PORCINE) 1000 UNIT/ML IJ SOLN
INTRAMUSCULAR | Status: DC | PRN
  Administered 2015-10-10: 3500 [IU] via INTRAVENOUS
  Administered 2015-10-10: 2000 [IU] via INTRAVENOUS
  Administered 2015-10-10: 3500 [IU] via INTRAVENOUS

## 2015-10-10 MED ORDER — HEPARIN SODIUM (PORCINE) 1000 UNIT/ML IJ SOLN
INTRAMUSCULAR | Status: AC
  Filled 2015-10-10: qty 1

## 2015-10-10 MED ORDER — LIDOCAINE HCL (PF) 1 % IJ SOLN
INTRAMUSCULAR | Status: AC
  Filled 2015-10-10: qty 30

## 2015-10-10 MED ORDER — SODIUM CHLORIDE 0.9% FLUSH: 3.0000 mL | INTRAVENOUS | Status: DC | PRN

## 2015-10-10 MED ORDER — CLOPIDOGREL BISULFATE 75 MG PO TABS
75.0000 mg | ORAL_TABLET | Freq: Every day | ORAL | Status: DC
Start: 2015-10-11 — End: 2015-10-11

## 2015-10-10 MED ORDER — SODIUM CHLORIDE 0.9% FLUSH: 3.0000 mL | Freq: Two times a day (BID) | INTRAVENOUS | Status: DC

## 2015-10-10 MED ORDER — ASPIRIN 81 MG PO CHEW: 81.0000 mg | CHEWABLE_TABLET | ORAL | Status: DC

## 2015-10-10 MED ORDER — ATORVASTATIN CALCIUM 40 MG PO TABS
40.0000 mg | ORAL_TABLET | Freq: Every day | ORAL | Status: DC
  Administered 2015-10-11: 10:00:00 40 mg via ORAL
  Filled 2015-10-10 (×2): qty 1

## 2015-10-10 MED ORDER — LATANOPROST 0.005 % OP SOLN
1.0000 [drp] | Freq: Every day | OPHTHALMIC | Status: DC
  Administered 2015-10-10: 22:00:00 1 [drp] via OPHTHALMIC
  Filled 2015-10-10: qty 2.5

## 2015-10-10 MED ORDER — FENTANYL CITRATE (PF) 100 MCG/2ML IJ SOLN
INTRAMUSCULAR | Status: DC | PRN
  Administered 2015-10-10 (×2): 25 ug via INTRAVENOUS

## 2015-10-10 MED ORDER — MIDAZOLAM HCL 2 MG/2ML IJ SOLN
INTRAMUSCULAR | Status: AC
  Filled 2015-10-10: qty 2

## 2015-10-10 MED ORDER — HEPARIN (PORCINE) IN NACL 2-0.9 UNIT/ML-% IJ SOLN
INTRAMUSCULAR | Status: DC | PRN
  Administered 2015-10-10: 1000 mL

## 2015-10-10 MED ORDER — CYCLOBENZAPRINE HCL 10 MG PO TABS: 10.0000 mg | ORAL_TABLET | Freq: Three times a day (TID) | ORAL | Status: DC | PRN

## 2015-10-10 MED ORDER — PANTOPRAZOLE SODIUM 40 MG PO TBEC
40.0000 mg | DELAYED_RELEASE_TABLET | Freq: Every day | ORAL | Status: DC
  Filled 2015-10-10 (×2): qty 1

## 2015-10-10 SURGICAL SUPPLY — 20 items
BALLN EMERGE MR 2.5X12 (BALLOONS) ×2
BALLN ~~LOC~~ EUPHORA RX 3.25X8 (BALLOONS) ×2
BALLOON EMERGE MR 2.5X12 (BALLOONS) ×1 IMPLANT
BALLOON ~~LOC~~ EUPHORA RX 3.25X8 (BALLOONS) ×1 IMPLANT
CATH INFINITI 5 FR JL3.5 (CATHETERS) ×2 IMPLANT
CATH INFINITI 5FR ANG PIGTAIL (CATHETERS) ×2 IMPLANT
CATH INFINITI JR4 5F (CATHETERS) ×2 IMPLANT
CATH VISTA GUIDE 6FR XBLAD3.5 (CATHETERS) ×2 IMPLANT
CATH VISTA GUIDE 6FR XBLAD4 (CATHETERS) ×2 IMPLANT
DEVICE RAD COMP TR BAND LRG (VASCULAR PRODUCTS) ×2 IMPLANT
GLIDESHEATH SLEND SS 6F .021 (SHEATH) ×2 IMPLANT
KIT ENCORE 26 ADVANTAGE (KITS) ×2 IMPLANT
KIT HEART LEFT (KITS) ×2 IMPLANT
PACK CARDIAC CATHETERIZATION (CUSTOM PROCEDURE TRAY) ×2 IMPLANT
STENT PROMUS PREM MR 3.0X12 (Permanent Stent) ×2 IMPLANT
SYR MEDRAD MARK V 150ML (SYRINGE) ×2 IMPLANT
TRANSDUCER W/STOPCOCK (MISCELLANEOUS) ×2 IMPLANT
TUBING CIL FLEX 10 FLL-RA (TUBING) ×2 IMPLANT
WIRE ASAHI PROWATER 180CM (WIRE) ×2 IMPLANT
WIRE SAFE-T 1.5MM-J .035X260CM (WIRE) ×2 IMPLANT

## 2015-10-10 NOTE — H&P (View-Only) (Signed)
Cardiology Office Note   Date:  10/07/2015   ID:  Jay Wilkins, DOB 10/24/1940, MRN [id number]  PCP:  Jay Shelling, MD  Cardiologist:   Jay Headings, MD   Chief Complaint  Patient presents with  . 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 CAD    History of Present Illness: Jay Wilkins is a 75 y.o. male who presents for follow up for CAD  He had a chest CT several months ago .   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  He had no dyspnea or angina during the treadmill . (stopped due to completion)  The CT also showed COPD.   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 Laryngopharyngeal reflux   . GERD (gastroesophageal reflux disease)   . History of colonic polyps   . HLD (hyperlipidemia)   . RLS (restless legs syndrome)   . Increased intraocular pressure   . Prostatitis   . Branch retinal vein occlusion 2009  . Sciatica   . Kidney trauma 1986  . Prostate cancer ([place])     Stage T1 C,    Past Surgical History  Procedure Laterality Date  . Tonsillectomy    . Prostatectomy    . Inguinal hernia repair Right 2005  . Brvo surgery  2009  . Cataract extraction  2014  . Colonoscopy with propofol N/A 04/08/2015    Procedure: COLONOSCOPY WITH PROPOFOL;  Surgeon: Jay Fair, MD;  Location: [facility];  Service: Endoscopy;  Laterality: N/A;     Current Outpatient Prescriptions  Medication Sig Dispense Refill  . aspirin EC 81 MG tablet Take 81 mg by mouth every morning.    Marland Kitchen atorvastatin (LIPITOR) 10 MG tablet Take  10 mg by mouth every morning.    . cyclobenzaprine (FLEXERIL) 10 MG tablet Take 10 mg by mouth 3 (three) times daily as needed for muscle spasms.    Marland Kitchen esomeprazole (NEXIUM) 40 MG capsule Take 40 mg by mouth as needed (as needed for heartburn or indigestion).     Marland Kitchen ibuprofen (ADVIL,MOTRIN) 200 MG tablet Take 600 mg by mouth every 6 (six) hours as needed for moderate pain.    Marland Kitchen latanoprost (XALATAN) 0.005 % ophthalmic solution Place 1 drop into both eyes at bedtime.    . Multiple Vitamins-Minerals (CENTRUM SILVER PO) Take 1 tablet by mouth daily.    . sodium chloride (OCEAN) 0.65 % SOLN nasal spray Place 1 spray into both nostrils daily as needed for congestion.     No current facility-administered medications for this visit.    Allergies:   Review of patient's allergies indicates no known allergies.    Social History:  The patient  reports that he has been smoking Cigarettes.  He has never used smokeless tobacco. He reports that he drinks alcohol. He reports that he does not use illicit drugs.   Family History:  The patient's family history includes Alcoholism in his sister; CAD  in his mother; Heart attack in his brother, father, and mother; Peripheral vascular disease in his mother.    ROS:  Please see the history of present illness.    Review of Systems: Constitutional:  denies fever, chills, diaphoresis, appetite change and fatigue.  HEENT: denies photophobia, eye pain, redness, hearing loss, ear pain, congestion, sore throat, rhinorrhea, sneezing, neck pain, neck stiffness and tinnitus.  Respiratory: denies SOB, DOE, cough, chest tightness, and wheezing.  Cardiovascular: denies chest pain, palpitations and leg swelling.  Gastrointestinal: denies nausea, vomiting, abdominal pain, diarrhea, constipation, blood in stool.  Genitourinary: denies dysuria, urgency, frequency, hematuria, flank pain and difficulty urinating.  Musculoskeletal: denies  myalgias, back pain, joint swelling,  arthralgias and gait problem.   Skin: denies pallor, rash and wound.  Neurological: denies dizziness, seizures, syncope, weakness, light-headedness, numbness and headaches.   Hematological: denies adenopathy, easy bruising, personal or family bleeding history.  Psychiatric/ Behavioral: denies suicidal ideation, mood changes, confusion, nervousness, sleep disturbance and agitation.       All other systems are reviewed and negative.    PHYSICAL EXAM: VS:  BP 132/76 mmHg  Pulse 71  Ht 5\' 8"  (1.727 m)  Wt 159 lb 6.4 oz (72.303 kg)  BMI 24.24 kg/m2 , BMI Body mass index is 24.24 kg/(m^2). GEN: Well nourished, well developed, in no acute distress HEENT: normal Neck: no JVD, carotid bruits, or masses Cardiac: RRR; no murmurs, rubs, or gallops,no edema  Respiratory:  clear to auscultation bilaterally, normal work of breathing GI: soft, nontender, nondistended, + BS MS: no deformity or atrophy Skin: warm and dry, no rash Neuro:  Strength and sensation are intact Psych: normal   EKG:  EKG is ordered today. The ekg ordered today demonstrates  NSR at 71,  Normal ECG   Recent Labs: No results found for requested labs within last 365 days.    Lipid Panel No results found for: CHOL, TRIG, HDL, CHOLHDL, VLDL, LDLCALC, LDLDIRECT    Wt Readings from Last 3 Encounters:  10/07/15 159 lb 6.4 oz (72.303 kg)  09/03/15 150 lb (68.04 kg)  04/08/15 150 lb (68.04 kg)      Other studies Reviewed: Additional studies/ records that were reviewed today include: . Review of the above records demonstrates:    ASSESSMENT AND PLAN:  1.  CAD -  Pt had a CT of the chest - was found to have extensive coronary ary calcification of the LM and all 3 coronary arteries.   A strss myoview showed  previous Inf. Wall.  Will need a Cath -  Have scheduled it for Thursday  Started Coreg 3.125 BID Already on asa Pre cath labs Will see me in 3-4 weeks.   Current medicines are reviewed at length with the  patient today.  The patient does not have concerns regarding medicines.  The following changes have been made:  no change  Labs/ tests ordered today include:  No orders of the defined types were placed in this encounter.     Disposition:   FU with me in several weeks      Dajanay Northrup, Wonda Cheng, MD  10/07/2015 3:47 PM    [facility] [place], [place], [place]  [address] Phone: [phone]; Fax: [phone]   [facility]  [address] [place], [place]  [address] [phone]   Fax [phone]

## 2015-10-10 NOTE — Interval H&P Note (Signed)
History and Physical Interval Note:  10/10/2015 3:16 PM  Jay Wilkins  has presented today for surgery, with the diagnosis of cad  The various methods of treatment have been discussed with the patient and family. After consideration of risks, benefits and other options for treatment, the patient has consented to  Procedure(s): Left Heart Cath and Coronary Angiography (N/A) as a surgical intervention .  The patient's history has been reviewed, patient examined, no change in status, stable for surgery.  I have reviewed the patient's chart and labs.  Questions were answered to the patient's satisfaction.   Cath Lab Visit (complete for each Cath Lab visit)  Clinical Evaluation Leading to the Procedure:   ACS: No.  Non-ACS:    Anginal Classification: CCS II  Anti-ischemic medical therapy: No Therapy  Non-Invasive Test Results: Intermediate-risk stress test findings: cardiac mortality 1-3%/year  Prior CABG: No previous CABG        [name] [facility] 10/10/2015 3:16 PM

## 2015-10-10 NOTE — [facility] Notes (Signed)
Patient transferred from cath lab, at 1620 arrived on 6 central

## 2015-10-11 ENCOUNTER — Other Ambulatory Visit (HOSPITAL_COMMUNITY): Payer: Self-pay | Admitting: Cardiology

## 2015-10-11 ENCOUNTER — Encounter (HOSPITAL_COMMUNITY): Payer: Self-pay | Admitting: Cardiology

## 2015-10-11 ENCOUNTER — Other Ambulatory Visit: Payer: Self-pay | Admitting: *Deleted

## 2015-10-11 DIAGNOSIS — R0600 Dyspnea, unspecified: Secondary | ICD-10-CM | POA: Diagnosis present

## 2015-10-11 DIAGNOSIS — E785 Hyperlipidemia, unspecified: Secondary | ICD-10-CM | POA: Diagnosis not present

## 2015-10-11 DIAGNOSIS — I2510 Atherosclerotic heart disease of native coronary artery without angina pectoris: Secondary | ICD-10-CM | POA: Diagnosis not present

## 2015-10-11 DIAGNOSIS — Z9861 Coronary angioplasty status: Secondary | ICD-10-CM

## 2015-10-11 DIAGNOSIS — Z8546 Personal history of malignant neoplasm of prostate: Secondary | ICD-10-CM | POA: Diagnosis not present

## 2015-10-11 DIAGNOSIS — R931 Abnormal findings on diagnostic imaging of heart and coronary circulation: Secondary | ICD-10-CM | POA: Diagnosis not present

## 2015-10-11 DIAGNOSIS — Z87891 Personal history of nicotine dependence: Secondary | ICD-10-CM | POA: Diagnosis not present

## 2015-10-11 DIAGNOSIS — I10 Essential (primary) hypertension: Secondary | ICD-10-CM | POA: Diagnosis present

## 2015-10-11 DIAGNOSIS — K219 Gastro-esophageal reflux disease without esophagitis: Secondary | ICD-10-CM | POA: Diagnosis present

## 2015-10-11 DIAGNOSIS — I252 Old myocardial infarction: Secondary | ICD-10-CM | POA: Diagnosis not present

## 2015-10-11 DIAGNOSIS — I255 Ischemic cardiomyopathy: Secondary | ICD-10-CM | POA: Diagnosis present

## 2015-10-11 DIAGNOSIS — J449 Chronic obstructive pulmonary disease, unspecified: Secondary | ICD-10-CM | POA: Diagnosis not present

## 2015-10-11 DIAGNOSIS — J439 Emphysema, unspecified: Secondary | ICD-10-CM | POA: Diagnosis present

## 2015-10-11 DIAGNOSIS — R0609 Other forms of dyspnea: Secondary | ICD-10-CM

## 2015-10-11 LAB — CBC
HCT: 40.5 % (ref 39.0–52.0)
Hemoglobin: 13.6 g/dL (ref 13.0–17.0)
MCH: 32.8 pg (ref 26.0–34.0)
MCHC: 33.6 g/dL (ref 30.0–36.0)
MCV: 97.6 fL (ref 78.0–100.0)
Platelets: 216 10*3/uL (ref 150–400)
RBC: 4.15 MIL/uL — ABNORMAL LOW (ref 4.22–5.81)
RDW: 14.9 % (ref 11.5–15.5)
WBC: 13.5 10*3/uL — ABNORMAL HIGH (ref 4.0–10.5)

## 2015-10-11 LAB — BASIC METABOLIC PANEL
Anion gap: 10 (ref 5–15)
BUN: 16 mg/dL (ref 6–20)
CO2: 24 mmol/L (ref 22–32)
Calcium: 8.7 mg/dL — ABNORMAL LOW (ref 8.9–10.3)
Chloride: 106 mmol/L (ref 101–111)
Creatinine, Ser: 0.97 mg/dL (ref 0.61–1.24)
GFR calc Af Amer: >60 mL/min (ref >60)
GFR calc non Af Amer: >60 mL/min (ref >60)
Glucose, Bld: 91 mg/dL (ref 65–99)
Potassium: 4.2 mmol/L (ref 3.5–5.1)
Sodium: 140 mmol/L (ref 135–145)

## 2015-10-11 LAB — POCT ACTIVATED CLOTTING TIME: Activated Clotting Time: 456 seconds

## 2015-10-11 MED ORDER — NITROGLYCERIN 0.4 MG SL SUBL: 0.4000 mg | SUBLINGUAL_TABLET | SUBLINGUAL | Status: DC | PRN

## 2015-10-11 MED ORDER — AMBULATORY NON FORMULARY MEDICATION: 81.0000 mg | Freq: Every day | Status: DC

## 2015-10-11 MED ORDER — ATORVASTATIN CALCIUM 40 MG PO TABS: 40.0000 mg | ORAL_TABLET | Freq: Every day | ORAL | Status: DC

## 2015-10-11 MED ORDER — TICAGRELOR 90 MG PO TABS: 90.0000 mg | ORAL_TABLET | Freq: Two times a day (BID) | ORAL | Status: DC

## 2015-10-11 MED ORDER — ACETAMINOPHEN 325 MG PO TABS: 650.0000 mg | ORAL_TABLET | ORAL | Status: DC | PRN

## 2015-10-11 MED ORDER — AMBULATORY NON FORMULARY MEDICATION: 90.0000 mg | Freq: Two times a day (BID) | Status: DC

## 2015-10-11 MED ORDER — TICAGRELOR 90 MG PO TABS
180.0000 mg | ORAL_TABLET | Freq: Once | ORAL | Status: AC
  Administered 2015-10-11: 10:00:00 180 mg via ORAL
  Filled 2015-10-11: qty 2

## 2015-10-11 MED FILL — Verapamil HCl IV Soln 2.5 MG/ML: INTRAVENOUS | Qty: 2 | Status: AC

## 2015-10-11 NOTE — Care Management Note (Signed)
Case Management Note  Patient Details  Name: Jay Wilkins MRN: [id number] Date of Birth: [birth year]  Subjective/Objective:    Patient is from home with wife, has decided to participate in the [place] Study for [place].  No needs.                Action/Plan:   Expected Discharge Date:                  Expected Discharge Plan:  Home/Self Care  In-House Referral:     Discharge planning Services  CM Consult  Post Acute Care Choice:    Choice offered to:     DME Arranged:    DME Agency:     HH Arranged:    [place] Agency:     Status of Service:  Completed, signed off  Medicare Important Message Given:    Date Medicare IM Given:    Medicare IM give by:    Date Additional Medicare IM Given:    Additional Medicare Important Message give by:     If discussed at [place] of Stay Meetings, dates discussed:    Additional Comments:  Zenon Mayo, RN 10/11/2015, 11:39 AM

## 2015-10-11 NOTE — Research (Signed)
Patient educated using Teach Back Method on Ticagrelor. Pt understands to take Ticagrelor 90mg  by mouth BID, and ASA 81mg  by mouth daily. Research Nurse will call patient in one month. Pt verbalizes he will call Research Office if he has any questions regarding Ticagrelor.

## 2015-10-11 NOTE — Discharge Instructions (Signed)
Coronary Angiogram With Stent, Care After °Refer to this sheet in the next few weeks. These instructions provide you with information about caring for yourself after your procedure. Your health care provider may also give you more specific instructions. Your treatment has been planned according to current medical practices, but problems sometimes occur. Call your health care provider if you have any problems or questions after your procedure. °WHAT TO EXPECT AFTER THE PROCEDURE  °After your procedure, it is typical to have the following: °· Bruising at the catheter insertion site that usually fades within 1-2 weeks. °· Blood collecting in the tissue (hematoma) that may be painful to the touch. It should usually decrease in size and tenderness within 1-2 weeks. °HOME CARE INSTRUCTIONS °· Take medicines only as directed by your health care provider. Blood thinners may be prescribed after your procedure to improve blood flow through the stent. °· You may shower 24-48 hours after the procedure or as directed by your health care provider. Remove the bandage (dressing) and gently wash the catheter insertion site with plain soap and water. Pat the area dry with a clean towel. Do not rub the site, because this may cause bleeding. °· Do not take baths, swim, or use a hot tub until your health care provider approves. °· Check your catheter insertion site every day for redness, swelling, or drainage. °· Do not apply powder or lotion to the site. °· Do not lift over 10 lb (4.5 kg) for 5 days after your procedure or as directed by your health care provider. °· Ask your health care provider when it is okay to: °¨ Return to work or school. °¨ Resume usual physical activities or sports. °¨ Resume sexual activity. °· Eat a heart-healthy diet. This should include plenty of fresh fruits and vegetables. Meat should be lean cuts. Avoid the following types of food: °¨ Food that is high in salt. °¨ Canned or highly processed food. °¨ Food  that is high in saturated fat or sugar. °¨ Fried food. °· Make any other lifestyle changes as recommended by your health care provider. These may include: °¨ Not using any tobacco products, including cigarettes, chewing tobacco, or electronic cigarettes. If you need help quitting, ask your health care provider. °¨ Managing your weight. °¨ Getting regular exercise. °¨ Managing your blood pressure. °¨ Limiting your alcohol intake. °¨ Managing other health problems, such as diabetes. °· If you need an MRI after your heart stent has been placed, be sure to tell the health care provider who orders the MRI that you have a heart stent. °· Keep all follow-up visits as directed by your health care provider. This is important. °SEEK MEDICAL CARE IF: °· You have a fever. °· You have chills. °· You have increased bleeding from the catheter insertion site. Hold pressure on the site. °SEEK IMMEDIATE MEDICAL CARE IF: °· You develop chest pain or shortness of breath, feel faint, or pass out. °· You have unusual pain at the catheter insertion site. °· You have redness, warmth, or swelling at the catheter insertion site. °· You have drainage (other than a small amount of blood on the dressing) from the catheter insertion site. °· The catheter insertion site is bleeding, and the bleeding does not stop after 30 minutes of holding steady pressure on the site. °· You develop bleeding from any other place, such as from your rectum. There may be bright red blood in your urine or stool, or it may appear as black, tarry stool. °  °  This information is not intended to replace advice given to you by your health care provider. Make sure you discuss any questions you have with your health care provider. °  °Document Released: 02/27/2005 Document Revised: 08/31/2014 Document Reviewed: 01/02/2013 °Elsevier Interactive Patient Education ©2016 Elsevier Inc. ° °

## 2015-10-11 NOTE — Discharge Summary (Signed)
Discharge Summary    Patient ID: Jay Wilkins,  MRN: [id number], DOB/AGE: 75-May-1942 75 y.o.  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 male who presents for follow up for CAD  Hospital Course     75 y.o. Male male with COPD, HLD, and FM Hx of of CAD, seen by Dr Acie Fredrickson for [place]. He had a Myoview that was intermediate. Chest CT revealed Ca++ CAD. He was admitted for elective cath 10/10/15. This revealed occluded PDA, 85% LAD. The LAD was intervened on with good results. There was mild LVD on his Myoview- EF 43%. His statin therapy has been increased. He has a f/u with Dr Acie Fredrickson March 20th.  _____________  Discharge Vitals Blood pressure 109/61, pulse 74, temperature 97.8 F (36.6 C), temperature source Oral, resp. rate 16, height 5\' 8"  (1.727 m), weight 172 lb 6.4 oz (78.2 kg), SpO2 98 %.  Filed Weights   10/10/15 1031 10/11/15 0429  Weight: 159 lb (72.122 kg) 172 lb 6.4 oz (78.2 kg)    Labs & Radiologic Studies     CBC  Recent Labs  10/11/15 0450  WBC 13.5*  HGB 13.6  HCT 40.5  MCV 97.6  PLT 123XX123   Basic Metabolic Panel  Recent Labs  10/11/15 0450  NA 140  K 4.2  CL 106  CO2 24  GLUCOSE 91  BUN 16  CREATININE 0.97  CALCIUM 8.7*   Liver Function Tests No results for input(s): AST, ALT, ALKPHOS, BILITOT, PROT, ALBUMIN in the last 72 hours. No results for input(s): LIPASE, AMYLASE in the last 72 hours. Cardiac Enzymes No results for input(s):  CKTOTAL, CKMB, CKMBINDEX, TROPONINI in the last 72 hours. BNP Invalid input(s): POCBNP D-Dimer No results for input(s): DDIMER in the last 72 hours. Hemoglobin A1C No results for input(s): HGBA1C in the last 72 hours. Fasting Lipid Panel No results for input(s): CHOL, HDL, LDLCALC, TRIG, CHOLHDL, LDLDIRECT in the last 72 hours. Thyroid Function Tests No results for input(s): TSH, T4TOTAL, T3FREE, THYROIDAB in the last 72 hours.  Invalid input(s): FREET3  No results found.  Disposition   Pt is being discharged home today in good condition.  Follow-up Plans & Appointments    Follow-up Information    Follow up with Nahser, Wonda Cheng, MD On 11/11/2015.   Specialty:  Cardiology   Why:  9:30   Contact information:   [address] [place] [address] [phone]      Discharge Instructions    AMB Referral to Cardiac Rehabilitation - Phase II    Complete by:  As directed   Diagnosis:  PCI           Discharge Medications   Current Discharge Medication List    START taking these medications   Details  acetaminophen (TYLENOL) 325 MG tablet Take 2 tablets (650 mg total) by mouth every 4 (four) hours as needed for mild pain, moderate pain, fever or headache.    ticagrelor (  BRILINTA) 90 MG TABS tablet Take 1 tablet (90 mg total) by mouth 2 (two) times daily. Qty: 60 tablet      CONTINUE these medications which have CHANGED   Details  atorvastatin (LIPITOR) 40 MG tablet Take 1 tablet (40 mg total) by mouth daily at 6 PM. Qty: 90 tablet, Refills: 3      CONTINUE these medications which have NOT CHANGED   Details  aspirin EC 81 MG tablet Take 81 mg by mouth every morning.    carvedilol (COREG) 3.125 MG tablet Take 1 tablet (3.125 mg total) by mouth 2 (two) times daily. Qty: 60 tablet, Refills: 11    esomeprazole (NEXIUM) 40 MG capsule Take 40 mg by mouth as needed (as needed for heartburn or indigestion).     latanoprost (XALATAN) 0.005 % ophthalmic  solution Place 1 drop into both eyes at bedtime.    Multiple Vitamins-Minerals (CENTRUM SILVER PO) Take 1 tablet by mouth daily.    cyclobenzaprine (FLEXERIL) 10 MG tablet Take 10 mg by mouth 3 (three) times daily as needed for muscle spasms.      STOP taking these medications     ibuprofen (ADVIL,MOTRIN) 200 MG tablet          Aspirin prescribed at discharge?  Yes High Intensity Statin Prescribed? (Lipitor 40-80mg  or Crestor 20-40mg ): Yes Beta Blocker Prescribed? Yes For EF 45% or less, Was ACEI/ARB Prescribed? No: NA ADP Receptor Inhibitor Prescribed? (i.e. Plavix etc.-Includes Medically Managed Patients): Yes For EF <40%, Aldosterone Inhibitor Prescribed? No: NA Was EF assessed during THIS hospitalization? Yes Was Cardiac Rehab II ordered? (Included Medically managed Patients): Yes   Outstanding Labs/Studies     Duration of Discharge Encounter   Greater than 30 minutes including physician time.  Angelena Form K PA 10/11/2015, 10:20 AM

## 2015-10-11 NOTE — Progress Notes (Signed)
    Subjective:  No chest pain or SOB  Objective:  Vital Signs in the last 24 hours: Temp:  [97.2 F (36.2 C)-97.9 F (36.6 C)] 97.8 F (36.6 C) (02/17 0820) Pulse Rate:  [66-77] 74 (02/17 0820) Resp:  [12-22] 16 (02/17 0820) BP: (109-156)/(51-97) 109/61 mmHg (02/17 0820) SpO2:  [97 %-100 %] 98 % (02/17 0820) Weight:  [159 lb (72.122 kg)-172 lb 6.4 oz (78.2 kg)] 172 lb 6.4 oz (78.2 kg) (02/17 0429)  Intake/Output from previous day:  Intake/Output Summary (Last 24 hours) at 10/11/15 1010 Last data filed at 10/11/15 0953  Gross per 24 hour  Intake 960.75 ml  Output    875 ml  Net  85.75 ml    Physical Exam: General appearance: alert, cooperative and no distress Neck: no carotid bruit and no JVD Lungs: clear to auscultation bilaterally Heart: regular rate and rhythm Extremities: Rt radial site without hematoma   Rate: 74  Rhythm: normal sinus rhythm  Lab Results:  Recent Labs  10/11/15 0450  WBC 13.5*  HGB 13.6  PLT 216    Recent Labs  10/11/15 0450  NA 140  K 4.2  CL 106  CO2 24  GLUCOSE 91  BUN 16  CREATININE 0.97   No results for input(s): TROPONINI in the last 72 hours.  Invalid input(s): CK, MB No results for input(s): INR in the last 72 hours.  Scheduled Meds: . aspirin EC  81 mg Oral q morning - 10a  . atorvastatin  40 mg Oral q1800  . carvedilol  3.125 mg Oral BID WC  . latanoprost  1 drop Both Eyes QHS  . multivitamin with minerals   Oral Daily  . pantoprazole  40 mg Oral Daily  . sodium chloride flush  3 mL Intravenous Q12H   Continuous Infusions:  PRN Meds:.sodium chloride, acetaminophen, cyclobenzaprine, ondansetron (ZOFRAN) IV, sodium chloride flush   Imaging: Imaging results have been reviewed   Assessment/Plan:  75 y.o. Male male with COPD, HLD, and FM Hx of of CAD, seen by Dr Acie Fredrickson for [place]. He had a Myoview that was intermediate. Chest CT revealed Ca++ CAD. He was admitted for elective cath 10/10/15. This revealed occluded  PDA, 85% LAD. The LAD was intervened on with good results. There was mild LVD.   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 Continue increased statin dose. Pt enrolled inTwilight study.   Kerin Ransom PA-C 10/11/2015, 10:10 AM [phone]  I have seen and examined the patient along with Kerin Ransom PA-C.  I have reviewed the chart, notes and new data.  I agree with PA's note.  Key new complaints: no angina, no radial access site complications Key examination changes: no HF or arrhythmia on exam, healthy radial site  PLAN: DC home. Reinforced compliance with DAPT. F/U with Dr.Nahser. Enrolled in TWILIGHT study.  Sanda Klein, MD, [place] [phone] 10/11/2015, 10:19 AM

## 2015-10-11 NOTE — Research (Signed)
TWILIGHT RESEARCH STUDY Informed Consent   Subject Name: Jay Wilkins  Subject met inclusion and exclusion criteria.  The informed consent form, study requirements and expectations were reviewed with the subject and questions and concerns were addressed prior to the signing of the consent form.  The subject verbalized understanding of the trial requirements.  The subject agreed to participate in the [place] trial and signed the informed consent.  The informed consent was obtained prior to performance of any protocol-specific procedures for the subject.  A copy of the signed informed consent was given to the subject and a copy was placed in the subject's medical record.  Marlana Salvage 10/11/2015, 0620 AM

## 2015-10-11 NOTE — Progress Notes (Signed)
CARDIAC REHAB PHASE I   PRE:  Rate/Rhythm: 77 SR PACs  BP:  Supine:   Sitting: 116/65  Standing:    SaO2:   MODE:  Ambulation: 600 ft   POST:  Rate/Rhythm: 80 SR  BP:  Supine:   Sitting: 109/61  Standing:    SaO2:  [id number] Pt walked 600 ft with steady gait. No CP. Tolerated well. Education completed with pt and wife who voiced understanding. Stressed importance of brilinta with stent. Gave heart healthy diet, ex ed and reviewed NTG use. Referring to [place] phase 2.    Graylon Good, RN BSN  10/11/2015 8:44 AM

## 2015-10-15 DIAGNOSIS — H35372 Puckering of macula, left eye: Secondary | ICD-10-CM | POA: Diagnosis not present

## 2015-10-15 DIAGNOSIS — H40012 Open angle with borderline findings, low risk, left eye: Secondary | ICD-10-CM | POA: Diagnosis not present

## 2015-10-15 DIAGNOSIS — H52203 Unspecified astigmatism, bilateral: Secondary | ICD-10-CM | POA: Diagnosis not present

## 2015-10-15 DIAGNOSIS — H40011 Open angle with borderline findings, low risk, right eye: Secondary | ICD-10-CM | POA: Diagnosis not present

## 2015-10-22 ENCOUNTER — Other Ambulatory Visit: Payer: Self-pay | Admitting: *Deleted

## 2015-10-22 ENCOUNTER — Other Ambulatory Visit: Payer: Self-pay | Admitting: Cardiovascular Disease

## 2015-10-22 DIAGNOSIS — Z9861 Coronary angioplasty status: Principal | ICD-10-CM

## 2015-10-22 DIAGNOSIS — I2510 Atherosclerotic heart disease of native coronary artery without angina pectoris: Secondary | ICD-10-CM

## 2015-10-22 MED ORDER — NITROGLYCERIN 0.4 MG SL SUBL: 0.4000 mg | SUBLINGUAL_TABLET | SUBLINGUAL | Status: DC | PRN

## 2015-10-23 DIAGNOSIS — D72829 Elevated white blood cell count, unspecified: Secondary | ICD-10-CM | POA: Diagnosis not present

## 2015-10-24 DIAGNOSIS — I2510 Atherosclerotic heart disease of native coronary artery without angina pectoris: Secondary | ICD-10-CM | POA: Diagnosis not present

## 2015-10-24 DIAGNOSIS — Z720 Tobacco use: Secondary | ICD-10-CM | POA: Diagnosis not present

## 2015-10-24 DIAGNOSIS — D72829 Elevated white blood cell count, unspecified: Secondary | ICD-10-CM | POA: Diagnosis not present

## 2015-10-28 ENCOUNTER — Other Ambulatory Visit: Payer: Self-pay

## 2015-10-28 DIAGNOSIS — Z9861 Coronary angioplasty status: Principal | ICD-10-CM

## 2015-10-28 DIAGNOSIS — I2510 Atherosclerotic heart disease of native coronary artery without angina pectoris: Secondary | ICD-10-CM

## 2015-10-30 ENCOUNTER — Ambulatory Visit (INDEPENDENT_AMBULATORY_CARE_PROVIDER_SITE_OTHER): Payer: Medicare Other | Admitting: Cardiovascular Disease

## 2015-10-30 ENCOUNTER — Encounter: Payer: Self-pay | Admitting: Cardiovascular Disease

## 2015-10-30 VITALS — BP 120/70 | HR 65 | Ht 67.0 in | Wt 157.1 lb

## 2015-10-30 DIAGNOSIS — Z8546 Personal history of malignant neoplasm of prostate: Secondary | ICD-10-CM | POA: Diagnosis not present

## 2015-10-30 DIAGNOSIS — I2510 Atherosclerotic heart disease of native coronary artery without angina pectoris: Secondary | ICD-10-CM

## 2015-10-30 DIAGNOSIS — E785 Hyperlipidemia, unspecified: Secondary | ICD-10-CM

## 2015-10-30 DIAGNOSIS — Z9861 Coronary angioplasty status: Secondary | ICD-10-CM | POA: Diagnosis not present

## 2015-10-30 MED ORDER — CARVEDILOL 3.125 MG PO TABS: 3.1250 mg | ORAL_TABLET | Freq: Two times a day (BID) | ORAL | Status: DC

## 2015-10-30 MED ORDER — ATORVASTATIN CALCIUM 40 MG PO TABS: 40.0000 mg | ORAL_TABLET | Freq: Every day | ORAL | Status: DC
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
## 2015-10-30 NOTE — Patient Instructions (Signed)

## 2015-11-05 ENCOUNTER — Encounter (HOSPITAL_COMMUNITY): Payer: Self-pay

## 2015-11-05 ENCOUNTER — Encounter (HOSPITAL_COMMUNITY)
Admission: RE | Admit: 2015-11-05 | Discharge: 2015-11-05 | Disposition: A | Discharge status: Home / Self Care | Payer: Medicare Other | Source: Ambulatory Visit | Attending: Cardiovascular Disease | Admitting: Cardiovascular Disease

## 2015-11-05 VITALS — BP 124/78 | HR 72 | Ht 68.0 in | Wt 158.1 lb

## 2015-11-05 DIAGNOSIS — Z955 Presence of coronary angioplasty implant and graft: Secondary | ICD-10-CM | POA: Diagnosis not present

## 2015-11-05 NOTE — Progress Notes (Signed)
Cardiac Rehab Medication Review by a Pharmacist  Does the patient  feel that his/her medications are working for him/her?  yes  Has the patient been experiencing any side effects to the medications prescribed?  Yes, new SOB and diarrhea believes it may be from the coreg  Does the patient measure his/her own blood pressure or blood glucose at home?  no   Does the patient have any problems obtaining medications due to transportation or finances?   no  Understanding of regimen: excellent Understanding of indications: excellent Potential of compliance: excellent    Pharmacist comments: Pt really understands his medications and plays and active role in his healthcare. No additional question for me at this visit.  Darl Pikes, PharmD Clinical Pharmacist- Resident Pager: [phone]   Darl Pikes 11/05/2015 2:20 PM

## 2015-11-05 NOTE — Progress Notes (Signed)
Mr Girten attended orientation this afternoon and was noted to have frequent PVC's on his ECG tracing prior to his walk test. Vital signs stable. Patient asymptomatic. Kerin Ransom [facility] called and notified. Lurena Joiner said the Mr Henline was okay to proceed with exercise. Mr Wheelhouse tolerated his walk test without difficulty. Will fax exercise today's ECG tracing to Dr. Elmarie Shiley office for review.
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
## 2015-11-11 ENCOUNTER — Ambulatory Visit: Payer: Medicare Other | Admitting: Cardiovascular Disease

## 2015-11-11 ENCOUNTER — Encounter (HOSPITAL_COMMUNITY)
Admission: RE | Admit: 2015-11-11 | Discharge: 2015-11-11 | Disposition: A | Discharge status: Home / Self Care | Payer: Medicare Other | Source: Ambulatory Visit | Attending: Cardiovascular Disease | Admitting: Cardiovascular Disease

## 2015-11-11 ENCOUNTER — Encounter (HOSPITAL_COMMUNITY): Payer: Self-pay

## 2015-11-11 DIAGNOSIS — Z955 Presence of coronary angioplasty implant and graft: Secondary | ICD-10-CM | POA: Diagnosis not present

## 2015-11-11 NOTE — Progress Notes (Signed)
Daily Session Note  Patient Details  Name: Jay Wilkins MRN: [id number] Date of Birth: 10-24-1940 Referring Provider:  Lavone Orn, MD  Encounter Date: 11/11/2015  Check In:     Session Check In - 11/11/15 0832    Check-In   Location [facility]   Staff Present Dorna Bloom, MS, ACSM RCEP, Exercise Physiologist;Jessica [place], MA, ACSM RCEP, Exercise Physiologist;Joann Rion, RN, Marga Melnick, RN, BSN   Supervising physician immediately available to respond to emergencies [facility] immediately available   Physician(s) Dr. Marily Memos   Medication changes reported     No   Fall or balance concerns reported    No   Warm-up and Cool-down Performed as group-led instruction   Resistance Training Performed Yes   VAD Patient? No   Pain Assessment   Currently in Pain? No/denies   Multiple Pain Sites No      Capillary Blood Glucose: No results found for this or any previous visit (from the past 24 hour(s)).   Goals Met:  Exercise tolerated well  Goals Unmet:  Not Applicable  Comments: Pt started cardiac rehab today.  Pt tolerated light exercise without difficulty. VSS, telemetry-sinus rhythm, PVC, asymptomatic.  Medication list reconciled. Pt denies barriers to medicaiton compliance.  PSYCHOSOCIAL ASSESSMENT:  PHQ-0. Pt exhibits positive coping skills, hopeful outlook with supportive family. No psychosocial needs identified at this time, no psychosocial interventions necessary.    Pt enjoys gardening, puzzles, church volunteer work, and spending time with his grandchildren who range in age from college to 4th grade.  Pt is congratulated on his smoking cessation.   Pt oriented to exercise equipment and routine.    Understanding verbalized.     Dr. Fransico Him is Medical Director for Cardiac Rehab at [facility].

## 2015-11-13 ENCOUNTER — Encounter (HOSPITAL_COMMUNITY)
Admission: RE | Admit: 2015-11-13 | Discharge: 2015-11-13 | Disposition: A | Discharge status: Home / Self Care | Payer: Medicare Other | Source: Ambulatory Visit | Attending: Cardiovascular Disease | Admitting: Cardiovascular Disease

## 2015-11-13 DIAGNOSIS — Z955 Presence of coronary angioplasty implant and graft: Secondary | ICD-10-CM

## 2015-11-15 ENCOUNTER — Encounter: Payer: Self-pay | Admitting: *Deleted

## 2015-11-15 ENCOUNTER — Encounter (HOSPITAL_COMMUNITY)
Admission: RE | Admit: 2015-11-15 | Discharge: 2015-11-15 | Disposition: A | Discharge status: Home / Self Care | Payer: Medicare Other | Source: Ambulatory Visit | Attending: Cardiovascular Disease | Admitting: Cardiovascular Disease

## 2015-11-15 DIAGNOSIS — Z006 Encounter for examination for normal comparison and control in clinical research program: Secondary | ICD-10-CM

## 2015-11-15 DIAGNOSIS — Z955 Presence of coronary angioplasty implant and graft: Secondary | ICD-10-CM

## 2015-11-18 ENCOUNTER — Encounter (HOSPITAL_COMMUNITY)
Admission: RE | Admit: 2015-11-18 | Discharge: 2015-11-18 | Disposition: A | Discharge status: Home / Self Care | Payer: Medicare Other | Source: Ambulatory Visit | Attending: Cardiovascular Disease | Admitting: Cardiovascular Disease

## 2015-11-18 DIAGNOSIS — Z955 Presence of coronary angioplasty implant and graft: Secondary | ICD-10-CM | POA: Diagnosis not present

## 2015-11-18 NOTE — Progress Notes (Signed)
TWILIGHT research 1 month telephone follow up completed. Patient denies any adverse or bleeding events. 100 % compliant with medication. Patient in Cardiac rehab 3 moth research appointment for randomization will be 01/08/16 after rehab i will meet him in rehab center.
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
## 2015-11-20 ENCOUNTER — Encounter (HOSPITAL_COMMUNITY)
Admission: RE | Admit: 2015-11-20 | Discharge: 2015-11-20 | Disposition: A | Discharge status: Home / Self Care | Payer: Medicare Other | Source: Ambulatory Visit | Attending: Cardiovascular Disease | Admitting: Cardiovascular Disease

## 2015-11-20 DIAGNOSIS — Z955 Presence of coronary angioplasty implant and graft: Secondary | ICD-10-CM

## 2015-11-20 NOTE — Progress Notes (Signed)
Reviewed home exercise with pt today.  Pt plans to walk 5-6 days a week and incorporate small hand held weights into routine at 2x/week for exercise.  Reviewed THR, pulse, RPE, sign and symptoms, NTG use, and when to call 911 or MD.  Also discussed weather considerations and indoor options.  Pt voiced understanding.   Jay Wilkins [employer]

## 2015-11-22 ENCOUNTER — Encounter (HOSPITAL_COMMUNITY)
Admission: RE | Admit: 2015-11-22 | Discharge: 2015-11-22 | Disposition: A | Discharge status: Home / Self Care | Payer: Medicare Other | Source: Ambulatory Visit | Attending: Cardiovascular Disease | Admitting: Cardiovascular Disease

## 2015-11-22 DIAGNOSIS — Z955 Presence of coronary angioplasty implant and graft: Secondary | ICD-10-CM

## 2015-11-22 NOTE — Progress Notes (Signed)
Jay Wilkins 75 y.o. male Nutrition Note Spoke with pt.  Nutrition Survey reviewed with pt. Pt is following Step 1 of the Therapeutic Lifestyle Changes diet. Pt expressed understanding of the information reviewed. Pt aware of nutrition education classes offered and plans on attending nutrition classes. No results found for: HGBA1C Wt Readings from Last 3 Encounters:  11/05/15 158 lb 1.1 oz (71.7 kg)  10/30/15 157 lb 1.9 oz (71.269 kg)  10/11/15 172 lb 6.4 oz (78.2 kg)   Nutrition Diagnosis ? Food-and nutrition-related knowledge deficit related to lack of exposure to information as related to diagnosis of: ? CVD Nutrition Intervention ? Benefits of adopting Therapeutic Lifestyle Changes discussed when [employer] reviewed. ? Pt to attend the Portion Distortion class ? Pt to attend the  ? Nutrition I class                        ? Nutrition II class ? Continue client-centered nutrition education by RD, as part of interdisciplinary care.  Goal(s) ? Pt to identify and limit food sources of saturated fat, trans fat, and sodium ? Pt to watch out for sweets and added sugar.  Monitor and Evaluate progress toward nutrition goal with team.  Derek Mound, M.Ed, RD, LDN, CDE 11/22/2015 9:32 AM

## 2015-11-25 ENCOUNTER — Encounter (HOSPITAL_COMMUNITY)
Admission: RE | Admit: 2015-11-25 | Discharge: 2015-11-25 | Disposition: A | Discharge status: Home / Self Care | Payer: Medicare Other | Source: Ambulatory Visit | Attending: Cardiovascular Disease | Admitting: Cardiovascular Disease

## 2015-11-25 DIAGNOSIS — Z955 Presence of coronary angioplasty implant and graft: Secondary | ICD-10-CM | POA: Diagnosis not present

## 2015-11-27 ENCOUNTER — Encounter (HOSPITAL_COMMUNITY)
Admission: RE | Admit: 2015-11-27 | Discharge: 2015-11-27 | Disposition: A | Discharge status: Home / Self Care | Payer: Medicare Other | Source: Ambulatory Visit | Attending: Cardiovascular Disease | Admitting: Cardiovascular Disease

## 2015-11-27 DIAGNOSIS — Z955 Presence of coronary angioplasty implant and graft: Secondary | ICD-10-CM | POA: Diagnosis not present

## 2015-11-29 ENCOUNTER — Encounter (HOSPITAL_COMMUNITY)
Admission: RE | Admit: 2015-11-29 | Discharge: 2015-11-29 | Disposition: A | Discharge status: Home / Self Care | Payer: Medicare Other | Source: Ambulatory Visit | Attending: Cardiovascular Disease | Admitting: Cardiovascular Disease

## 2015-11-29 DIAGNOSIS — Z955 Presence of coronary angioplasty implant and graft: Secondary | ICD-10-CM | POA: Diagnosis not present

## 2015-12-02 ENCOUNTER — Encounter (HOSPITAL_COMMUNITY)
Admission: RE | Admit: 2015-12-02 | Discharge: 2015-12-02 | Disposition: A | Discharge status: Home / Self Care | Payer: Medicare Other | Source: Ambulatory Visit | Attending: Cardiovascular Disease | Admitting: Cardiovascular Disease

## 2015-12-02 DIAGNOSIS — Z955 Presence of coronary angioplasty implant and graft: Secondary | ICD-10-CM | POA: Diagnosis not present

## 2015-12-04 ENCOUNTER — Encounter (HOSPITAL_COMMUNITY)
Admission: RE | Admit: 2015-12-04 | Discharge: 2015-12-04 | Disposition: A | Discharge status: Home / Self Care | Payer: Medicare Other | Source: Ambulatory Visit | Attending: Cardiovascular Disease | Admitting: Cardiovascular Disease

## 2015-12-04 DIAGNOSIS — Z955 Presence of coronary angioplasty implant and graft: Secondary | ICD-10-CM | POA: Diagnosis not present

## 2015-12-04 DIAGNOSIS — D72829 Elevated white blood cell count, unspecified: Secondary | ICD-10-CM | POA: Diagnosis not present

## 2015-12-04 DIAGNOSIS — H00014 Hordeolum externum left upper eyelid: Secondary | ICD-10-CM | POA: Diagnosis not present

## 2015-12-04 NOTE — Progress Notes (Signed)
Daily Session Note  Patient Details  Name: Jay Wilkins MRN: [id number] Date of Birth: 06/21/1941 Referring Provider: [place] Jack  Encounter Date: 12/04/2015  Check In:     Session Check In - 12/04/15 0840    Check-In   Location [facility]   Staff Present Dorna Bloom, MS, ACSM RCEP, Exercise Physiologist;Carlette Wilber Oliphant, RN, BSN   Supervising physician immediately available to respond to emergencies [facility] immediately available   Physician(s) Dr. Marily Memos   Medication changes reported     No   Fall or balance concerns reported    No   Warm-up and Cool-down Performed as group-led instruction   Resistance Training Performed No   VAD Patient? No   Pain Assessment   Currently in Pain? No/denies   Multiple Pain Sites No      Capillary Blood Glucose: No results found for this or any previous visit (from the past 24 hour(s)).   Goals Met:  No report of cardiac concerns or symptoms  Goals Unmet:  Not Applicable  Comments: Pt presents to exercise today for cardiac rehab.  Pt with redness and some swelling to his left eye.  No drainage noted.  Some tenderness when rubbing the eye.  Pt used warm compresses on his eye which made it feel better but did not improve the redness.  Due to the upcoming holiday weekend, advised pt to either be seen by urgent care or primary MD office.  Pt has appointment at [facility] for lab work. Called to see if there were any openings for pt's eye to be evaluated for further treatment.  Dr. Laurann Montana was full this morning but another one of his partners did have an open appt at 13.  Pt advised appt made for him to be seen.  Pt appreciative of this. [name] RN, BSN    Dr. Fransico Him is Medical Director for Cardiac Rehab at [facility]

## 2015-12-06 ENCOUNTER — Encounter (HOSPITAL_COMMUNITY)
Admission: RE | Admit: 2015-12-06 | Discharge: 2015-12-06 | Disposition: A | Discharge status: Home / Self Care | Payer: Medicare Other | Source: Ambulatory Visit | Attending: Cardiovascular Disease | Admitting: Cardiovascular Disease

## 2015-12-06 DIAGNOSIS — Z955 Presence of coronary angioplasty implant and graft: Secondary | ICD-10-CM | POA: Diagnosis not present

## 2015-12-09 ENCOUNTER — Encounter (HOSPITAL_COMMUNITY)
Admission: RE | Admit: 2015-12-09 | Discharge: 2015-12-09 | Disposition: A | Discharge status: Home / Self Care | Payer: Medicare Other | Source: Ambulatory Visit | Attending: Cardiovascular Disease | Admitting: Cardiovascular Disease

## 2015-12-09 DIAGNOSIS — Z955 Presence of coronary angioplasty implant and graft: Secondary | ICD-10-CM | POA: Diagnosis not present

## 2015-12-09 DIAGNOSIS — H00014 Hordeolum externum left upper eyelid: Secondary | ICD-10-CM | POA: Diagnosis not present

## 2015-12-10 DIAGNOSIS — D721 Eosinophilia: Secondary | ICD-10-CM | POA: Diagnosis not present

## 2015-12-11 ENCOUNTER — Encounter (HOSPITAL_COMMUNITY)
Admission: RE | Admit: 2015-12-11 | Discharge: 2015-12-11 | Disposition: A | Discharge status: Home / Self Care | Payer: Medicare Other | Source: Ambulatory Visit | Attending: Cardiovascular Disease | Admitting: Cardiovascular Disease

## 2015-12-11 DIAGNOSIS — D721 Eosinophilia: Secondary | ICD-10-CM | POA: Diagnosis not present

## 2015-12-11 DIAGNOSIS — Z955 Presence of coronary angioplasty implant and graft: Secondary | ICD-10-CM

## 2015-12-13 ENCOUNTER — Encounter (HOSPITAL_COMMUNITY): Payer: Medicare Other

## 2015-12-16 ENCOUNTER — Encounter (HOSPITAL_COMMUNITY): Payer: Medicare Other
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
## 2015-12-18 ENCOUNTER — Encounter (HOSPITAL_COMMUNITY)
Admission: RE | Admit: 2015-12-18 | Discharge: 2015-12-18 | Disposition: A | Discharge status: Home / Self Care | Payer: Medicare Other | Source: Ambulatory Visit | Attending: Cardiovascular Disease | Admitting: Cardiovascular Disease

## 2015-12-18 DIAGNOSIS — Z955 Presence of coronary angioplasty implant and graft: Secondary | ICD-10-CM | POA: Diagnosis not present

## 2015-12-20 ENCOUNTER — Encounter (HOSPITAL_COMMUNITY)
Admission: RE | Admit: 2015-12-20 | Discharge: 2015-12-20 | Disposition: A | Discharge status: Home / Self Care | Payer: Medicare Other | Source: Ambulatory Visit | Attending: Cardiovascular Disease | Admitting: Cardiovascular Disease

## 2015-12-20 DIAGNOSIS — Z955 Presence of coronary angioplasty implant and graft: Secondary | ICD-10-CM

## 2015-12-23 ENCOUNTER — Encounter (HOSPITAL_COMMUNITY)
Admission: RE | Admit: 2015-12-23 | Discharge: 2015-12-23 | Disposition: A | Discharge status: Home / Self Care | Payer: Medicare Other | Source: Ambulatory Visit | Attending: Cardiovascular Disease | Admitting: Cardiovascular Disease

## 2015-12-23 DIAGNOSIS — Z955 Presence of coronary angioplasty implant and graft: Secondary | ICD-10-CM | POA: Insufficient documentation

## 2015-12-25 ENCOUNTER — Encounter (HOSPITAL_COMMUNITY)
Admission: RE | Admit: 2015-12-25 | Discharge: 2015-12-25 | Disposition: A | Discharge status: Home / Self Care | Payer: Medicare Other | Source: Ambulatory Visit | Attending: Cardiovascular Disease | Admitting: Cardiovascular Disease

## 2015-12-25 DIAGNOSIS — Z955 Presence of coronary angioplasty implant and graft: Secondary | ICD-10-CM

## 2015-12-27 ENCOUNTER — Encounter (HOSPITAL_COMMUNITY)
Admission: RE | Admit: 2015-12-27 | Discharge: 2015-12-27 | Disposition: A | Discharge status: Home / Self Care | Payer: Medicare Other | Source: Ambulatory Visit | Attending: Cardiovascular Disease | Admitting: Cardiovascular Disease

## 2015-12-27 DIAGNOSIS — Z955 Presence of coronary angioplasty implant and graft: Secondary | ICD-10-CM | POA: Diagnosis not present

## 2015-12-30 ENCOUNTER — Encounter (HOSPITAL_COMMUNITY)
Admission: RE | Admit: 2015-12-30 | Discharge: 2015-12-30 | Disposition: A | Discharge status: Home / Self Care | Payer: Medicare Other | Source: Ambulatory Visit | Attending: Cardiovascular Disease | Admitting: Cardiovascular Disease

## 2015-12-30 DIAGNOSIS — Z955 Presence of coronary angioplasty implant and graft: Secondary | ICD-10-CM

## 2015-12-30 DIAGNOSIS — D721 Eosinophilia: Secondary | ICD-10-CM | POA: Diagnosis not present

## 2016-01-01 ENCOUNTER — Encounter (HOSPITAL_COMMUNITY)
Admission: RE | Admit: 2016-01-01 | Discharge: 2016-01-01 | Disposition: A | Discharge status: Home / Self Care | Payer: Medicare Other | Source: Ambulatory Visit | Attending: Cardiovascular Disease | Admitting: Cardiovascular Disease

## 2016-01-01 DIAGNOSIS — Z955 Presence of coronary angioplasty implant and graft: Secondary | ICD-10-CM | POA: Diagnosis not present

## 2016-01-03 ENCOUNTER — Encounter (HOSPITAL_COMMUNITY)
Admission: RE | Admit: 2016-01-03 | Discharge: 2016-01-03 | Disposition: A | Discharge status: Home / Self Care | Payer: Medicare Other | Source: Ambulatory Visit | Attending: Cardiovascular Disease | Admitting: Cardiovascular Disease

## 2016-01-03 DIAGNOSIS — H0014 Chalazion left upper eyelid: Secondary | ICD-10-CM | POA: Diagnosis not present

## 2016-01-03 DIAGNOSIS — Z955 Presence of coronary angioplasty implant and graft: Secondary | ICD-10-CM | POA: Diagnosis not present

## 2016-01-06 ENCOUNTER — Encounter (HOSPITAL_COMMUNITY)
Admission: RE | Admit: 2016-01-06 | Discharge: 2016-01-06 | Disposition: A | Discharge status: Home / Self Care | Payer: Medicare Other | Source: Ambulatory Visit | Attending: Cardiovascular Disease | Admitting: Cardiovascular Disease

## 2016-01-06 DIAGNOSIS — Z955 Presence of coronary angioplasty implant and graft: Secondary | ICD-10-CM | POA: Diagnosis not present

## 2016-01-08 ENCOUNTER — Encounter (HOSPITAL_COMMUNITY)
Admission: RE | Admit: 2016-01-08 | Discharge: 2016-01-08 | Disposition: A | Discharge status: Home / Self Care | Payer: Medicare Other | Source: Ambulatory Visit | Attending: Cardiovascular Disease | Admitting: Cardiovascular Disease

## 2016-01-08 ENCOUNTER — Other Ambulatory Visit: Payer: Self-pay | Admitting: *Deleted

## 2016-01-08 ENCOUNTER — Encounter: Payer: Self-pay | Admitting: *Deleted

## 2016-01-08 ENCOUNTER — Encounter: Payer: Self-pay | Admitting: Hematology and Oncology

## 2016-01-08 DIAGNOSIS — Z955 Presence of coronary angioplasty implant and graft: Secondary | ICD-10-CM

## 2016-01-08 DIAGNOSIS — Z006 Encounter for examination for normal comparison and control in clinical research program: Secondary | ICD-10-CM

## 2016-01-08 MED ORDER — AMBULATORY NON FORMULARY MEDICATION: 81.0000 mg | Freq: Every day | Status: DC

## 2016-01-08 NOTE — Progress Notes (Signed)
TWILIGHT Research study 3 month Randomization follow up completed. Patient was Randomized today to ASA 81 mg or PLACEBO. Patient denies any bleeding events or adverse events. He also states he has been 100% compliant with medication. Research follow up schedule given to patient. Instructed patient to NOT take any open label ASA. Questions encouraged and answered.

## 2016-01-10 ENCOUNTER — Encounter (HOSPITAL_COMMUNITY)
Admission: RE | Admit: 2016-01-10 | Discharge: 2016-01-10 | Disposition: A | Discharge status: Home / Self Care | Payer: Medicare Other | Source: Ambulatory Visit | Attending: Cardiovascular Disease | Admitting: Cardiovascular Disease

## 2016-01-10 DIAGNOSIS — Z955 Presence of coronary angioplasty implant and graft: Secondary | ICD-10-CM | POA: Diagnosis not present
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
## 2016-01-13 ENCOUNTER — Encounter (HOSPITAL_COMMUNITY)
Admission: RE | Admit: 2016-01-13 | Discharge: 2016-01-13 | Disposition: A | Discharge status: Home / Self Care | Payer: Medicare Other | Source: Ambulatory Visit | Attending: Cardiovascular Disease | Admitting: Cardiovascular Disease

## 2016-01-13 DIAGNOSIS — Z955 Presence of coronary angioplasty implant and graft: Secondary | ICD-10-CM | POA: Diagnosis not present

## 2016-01-15 ENCOUNTER — Encounter (HOSPITAL_COMMUNITY): Payer: Medicare Other

## 2016-01-17 ENCOUNTER — Encounter (HOSPITAL_COMMUNITY): Payer: Medicare Other

## 2016-01-20 ENCOUNTER — Encounter (HOSPITAL_COMMUNITY): Payer: Medicare Other

## 2016-01-22 ENCOUNTER — Encounter (HOSPITAL_COMMUNITY): Payer: Medicare Other

## 2016-01-24 ENCOUNTER — Encounter (HOSPITAL_COMMUNITY)
Admission: RE | Admit: 2016-01-24 | Discharge: 2016-01-24 | Disposition: A | Discharge status: Home / Self Care | Payer: Medicare Other | Source: Ambulatory Visit | Attending: Cardiovascular Disease | Admitting: Cardiovascular Disease

## 2016-01-24 DIAGNOSIS — Z955 Presence of coronary angioplasty implant and graft: Secondary | ICD-10-CM

## 2016-01-27 ENCOUNTER — Encounter: Payer: Self-pay | Admitting: Hematology and Oncology

## 2016-01-27 ENCOUNTER — Encounter (HOSPITAL_COMMUNITY)
Admission: RE | Admit: 2016-01-27 | Discharge: 2016-01-27 | Disposition: A | Discharge status: Home / Self Care | Payer: Medicare Other | Source: Ambulatory Visit | Attending: Cardiovascular Disease | Admitting: Cardiovascular Disease

## 2016-01-27 ENCOUNTER — Ambulatory Visit (HOSPITAL_BASED_OUTPATIENT_CLINIC_OR_DEPARTMENT_OTHER): Payer: Medicare Other | Admitting: Hematology and Oncology

## 2016-01-27 VITALS — BP 130/89 | HR 70 | Temp 98.5°F | Resp 18 | Ht 68.0 in | Wt 161.7 lb

## 2016-01-27 DIAGNOSIS — Z87891 Personal history of nicotine dependence: Secondary | ICD-10-CM | POA: Diagnosis not present

## 2016-01-27 DIAGNOSIS — D721 Eosinophilia, unspecified: Secondary | ICD-10-CM | POA: Insufficient documentation

## 2016-01-27 DIAGNOSIS — I2510 Atherosclerotic heart disease of native coronary artery without angina pectoris: Secondary | ICD-10-CM

## 2016-01-27 DIAGNOSIS — Z955 Presence of coronary angioplasty implant and graft: Secondary | ICD-10-CM

## 2016-01-27 NOTE — Progress Notes (Signed)
Per Dr. Lindi Adie, pt to have BM biopsy on 6/19 at 08:00.  POF entered for these appointments.  Lab and flow notified of this.

## 2016-01-27 NOTE — Assessment & Plan Note (Addendum)
Patient had low-grade eosinophilia in 2004 with an absolute eosinophil count of 1000. In February 2017, absolute eosinophil count was 6400 with a WBC count of 14,000. 12/04/2015: Absolute eosinophil count 5600 with a white blood cell count of 15.4  Differential diagnosis of eosinophilia 1. Infections: Patient does not have any evidence of infections. Strongiloides IgG negative, ESR normal 2. Inflammations: No clear-cut evidence of autoimmune diseases or inflammatory disease. Patient had heart catheterization February 2017 and underwent a stent placement. Patient had eosinophilia even prior to this. 3. Medications: Most of his new cardiac medications were started after he had known evidence of eosinophilia. 4. 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 5. Bone marrow disorders: Differential diagnosis hypereosinophilic syndrome, lymphoma, leukemia etc.  Plan: 1. Obtain bone marrow biopsy to evaluate for causes of eosinophilia. We will send the bone marrow for myeloproliferative disorders including cytogenetic and FISH panels. 2. If bone marrow desirous of ruled out then we can attribute eosinophilia to reactive causes.  Return to clinic on 19th of June for bone marrow biopsy.
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
## 2016-01-29 ENCOUNTER — Encounter (HOSPITAL_COMMUNITY): Payer: Medicare Other

## 2016-01-31 ENCOUNTER — Encounter (HOSPITAL_COMMUNITY): Payer: Medicare Other

## 2016-02-03 ENCOUNTER — Encounter (HOSPITAL_COMMUNITY): Admission: RE | Admit: 2016-02-03 | Payer: Medicare Other | Source: Ambulatory Visit

## 2016-02-05 ENCOUNTER — Encounter (HOSPITAL_COMMUNITY)
Admission: RE | Admit: 2016-02-05 | Discharge: 2016-02-05 | Disposition: A | Discharge status: Home / Self Care | Payer: Medicare Other | Source: Ambulatory Visit | Attending: Cardiovascular Disease | Admitting: Cardiovascular Disease

## 2016-02-05 DIAGNOSIS — Z955 Presence of coronary angioplasty implant and graft: Secondary | ICD-10-CM | POA: Diagnosis not present

## 2016-02-07 ENCOUNTER — Encounter (HOSPITAL_COMMUNITY)
Admission: RE | Admit: 2016-02-07 | Discharge: 2016-02-07 | Disposition: A | Discharge status: Home / Self Care | Payer: Medicare Other | Source: Ambulatory Visit | Attending: Cardiovascular Disease | Admitting: Cardiovascular Disease

## 2016-02-07 DIAGNOSIS — Z955 Presence of coronary angioplasty implant and graft: Secondary | ICD-10-CM

## 2016-02-10 ENCOUNTER — Telehealth: Payer: Self-pay | Admitting: Hematology and Oncology

## 2016-02-10 ENCOUNTER — Ambulatory Visit: Payer: Medicare Other | Admitting: Hematology and Oncology

## 2016-02-10 ENCOUNTER — Ambulatory Visit (HOSPITAL_BASED_OUTPATIENT_CLINIC_OR_DEPARTMENT_OTHER): Payer: Medicare Other | Admitting: Hematology and Oncology

## 2016-02-10 ENCOUNTER — Encounter (HOSPITAL_COMMUNITY): Payer: Medicare Other

## 2016-02-10 ENCOUNTER — Other Ambulatory Visit (HOSPITAL_COMMUNITY)
Admission: RE | Admit: 2016-02-10 | Discharge: 2016-02-10 | Disposition: A | Discharge status: Home / Self Care | Payer: Medicare Other | Source: Ambulatory Visit | Attending: Hematology and Oncology | Admitting: Hematology and Oncology

## 2016-02-10 ENCOUNTER — Encounter: Payer: Self-pay | Admitting: Hematology and Oncology

## 2016-02-10 ENCOUNTER — Ambulatory Visit (HOSPITAL_BASED_OUTPATIENT_CLINIC_OR_DEPARTMENT_OTHER): Payer: Medicare Other

## 2016-02-10 VITALS — BP 136/80 | HR 63 | Temp 98.2°F | Resp 17

## 2016-02-10 DIAGNOSIS — D721 Eosinophilia, unspecified: Secondary | ICD-10-CM

## 2016-02-10 LAB — CBC WITH DIFFERENTIAL/PLATELET
BASO%: 0.5 % (ref 0.0–2.0)
Basophils Absolute: 0.1 10*3/uL (ref 0.0–0.1)
EOS%: 50.8 % — ABNORMAL HIGH (ref 0.0–7.0)
Eosinophils Absolute: 7.1 10*3/uL — ABNORMAL HIGH (ref 0.0–0.5)
HCT: 41.3 % (ref 38.4–49.9)
HGB: 13.9 g/dL (ref 13.0–17.1)
LYMPH%: 11.6 % — ABNORMAL LOW (ref 14.0–49.0)
MCH: 33.6 pg — ABNORMAL HIGH (ref 27.2–33.4)
MCHC: 33.8 g/dL (ref 32.0–36.0)
MCV: 99.5 fL — ABNORMAL HIGH (ref 79.3–98.0)
MONO#: 0.8 10*3/uL (ref 0.1–0.9)
MONO%: 6 % (ref 0.0–14.0)
NEUT#: 4.3 10*3/uL (ref 1.5–6.5)
NEUT%: 31.1 % — ABNORMAL LOW (ref 39.0–75.0)
Platelets: 210 10*3/uL (ref 140–400)
RBC: 4.15 10*6/uL — ABNORMAL LOW (ref 4.20–5.82)
RDW: 14 % (ref 11.0–14.6)
WBC: 14 10*3/uL — ABNORMAL HIGH (ref 4.0–10.3)
lymph#: 1.6 10*3/uL (ref 0.9–3.3)

## 2016-02-10 LAB — BONE MARROW EXAM

## 2016-02-10 NOTE — Telephone Encounter (Signed)
appt made and avs printed °

## 2016-02-10 NOTE — Progress Notes (Signed)
Reason for bone marrow biopsy: Eosinophilia CC: Patient is here for bone marrow biopsy, does not have any new complaints or concerns. Continuing with cardiac rehabilitation.  Examination was performed. HEENT: Oral mucosa moist and clear Heart: regular rate and rhythm.No Murmurs Lungs: clear to auscultation, no wheezes, normal respiratory effort Abdomen: Not dyspneic splenomegaly Extremities no edema Neuro: Intact  Assessment and plan: 1. Eosinophilia long-term unclear etiology: Bone marrow biopsy be performed today Return to clinic in 1 week for follow-up to review results  Bone Marrow Biopsy and Aspiration Procedure Note   Informed consent was obtained and potential risks including bleeding, infection and pain were reviewed with the patient.  The patient's name, date of birth, identification, consent and allergies were verified prior to the start of procedure and time out was performed.  The left posterior iliac crest was chosen as the site of biopsy.  The skin was prepped with Betadine solution.   8 cc of 1% lidocaine was used to provide local anaesthesia.   10 cc of bone marrow aspirate was obtained followed by 1 inch biopsy.  Pressure was applied to the biopsy site and bandage was placed over the biopsy site. Patient was made to lie on her back for 15 mins prior to discharge.  The procedure was tolerated well. COMPLICATIONS: None BLOOD LOSS: none The patient was discharged home in stable condition with a 1 week follow up to review results. Specimens sent for flow cytometry, cytogenetics and additional studies based on the pathologist interpretation of the aspirate and biopsy.  Signed Rulon Eisenmenger, MD

## 2016-02-10 NOTE — Progress Notes (Signed)
Vitals obtained prior to procedure and stable as charted.  Procedural consent obtained.  Left sided bone marrow biopsy performed without complications.  Pt lying flat x 30 minutes post procedure.  Vitals obtained and remain stable as charted.  Bandage clean/dry/intact.  Instructions given to pt and reviewed with pt and wife.  Pt denies any further questions or concerns at time of discharge.  Pt discharged ambulatory accompanied by wife with no complaints.

## 2016-02-10 NOTE — Patient Instructions (Signed)
Bone Marrow Aspiration and Bone Marrow Biopsy, Care After  Refer to this sheet in the next few weeks. These instructions provide you with information about caring for yourself after your procedure. Your health care provider may also give you more specific instructions. Your treatment has been planned according to current medical practices, but problems sometimes occur. Call your health care provider if you have any problems or questions after your procedure.  WHAT TO EXPECT AFTER THE PROCEDURE  After your procedure, it is common to have:   Soreness or tenderness around the puncture site.   Bruising.  HOME CARE INSTRUCTIONS   Take medicines only as directed by your health care provider.   Follow your health care provider's instructions about:    Puncture site care.    Bandage (dressing) changes and removal.   Bathe and shower as directed by your health care provider.   Check your puncture site every day for signs of infection. Watch for:    Redness, swelling, or pain.    Fluid, blood, or pus.   Return to your normal activities as directed by your health care provider.   Keep all follow-up visits as directed by your health care provider. This is important.  SEEK MEDICAL CARE IF:   You have a fever.   You have uncontrollable bleeding.   You have redness, swelling, or pain at the site of your puncture.   You have fluid, blood, or pus coming from your puncture site.     This information is not intended to replace advice given to you by your health care provider. Make sure you discuss any questions you have with your health care provider.     Document Released: 02/27/2005 Document Revised: 12/25/2014 Document Reviewed: 08/01/2014  Elsevier Interactive Patient Education 2016 [employer]

## 2016-02-11 ENCOUNTER — Encounter: Payer: Self-pay | Admitting: *Deleted

## 2016-02-11 DIAGNOSIS — Z006 Encounter for examination for normal comparison and control in clinical research program: Secondary | ICD-10-CM

## 2016-02-11 NOTE — Progress Notes (Signed)
TWILIGHT Research study 4 month telephone call completed. Patient doing well not bleeding events or any other adverse events. Patient stated that his eosinophils have been elevated (even before heart cath) and he had a bone marrow asipration/biopsy yesterday. He states he has not had any cessation in Brilinta or ASA/study drug. Questions encouraged and answered.

## 2016-02-12 ENCOUNTER — Encounter (HOSPITAL_COMMUNITY)
Admission: RE | Admit: 2016-02-12 | Discharge: 2016-02-12 | Disposition: A | Discharge status: Home / Self Care | Payer: Medicare Other | Source: Ambulatory Visit | Attending: Cardiovascular Disease | Admitting: Cardiovascular Disease

## 2016-02-12 DIAGNOSIS — Z955 Presence of coronary angioplasty implant and graft: Secondary | ICD-10-CM | POA: Diagnosis not present

## 2016-02-12 NOTE — Progress Notes (Signed)
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 will continue to do better and have improved COPD symptoms      ITP Comments:     ITP Comments      11/06/15 0939           ITP Comments Dr Fransico Him Medical Director          Comments: Pt is making expected progress toward personal goals after completing 32 sessions. Recommend continued exercise and life style  modification education including  stress management and relaxation techniques to decrease cardiac risk profile.

## 2016-02-14 ENCOUNTER — Encounter (HOSPITAL_COMMUNITY)
Admission: RE | Admit: 2016-02-14 | Discharge: 2016-02-14 | Disposition: A | Discharge status: Home / Self Care | Payer: Medicare Other | Source: Ambulatory Visit | Attending: Cardiovascular Disease | Admitting: Cardiovascular Disease

## 2016-02-14 DIAGNOSIS — Z955 Presence of coronary angioplasty implant and graft: Secondary | ICD-10-CM

## 2016-02-17 ENCOUNTER — Encounter (HOSPITAL_COMMUNITY)
Admission: RE | Admit: 2016-02-17 | Discharge: 2016-02-17 | Disposition: A | Discharge status: Home / Self Care | Payer: Medicare Other | Source: Ambulatory Visit | Attending: Cardiovascular Disease | Admitting: Cardiovascular Disease

## 2016-02-17 DIAGNOSIS — Z955 Presence of coronary angioplasty implant and graft: Secondary | ICD-10-CM | POA: Diagnosis not present

## 2016-02-18 ENCOUNTER — Telehealth: Payer: Self-pay | Admitting: Hematology and Oncology

## 2016-02-18 ENCOUNTER — Encounter: Payer: Self-pay | Admitting: Hematology and Oncology

## 2016-02-18 ENCOUNTER — Ambulatory Visit (HOSPITAL_BASED_OUTPATIENT_CLINIC_OR_DEPARTMENT_OTHER): Payer: Medicare Other | Admitting: Hematology and Oncology

## 2016-02-18 VITALS — BP 135/72 | HR 58 | Temp 98.4°F | Resp 18 | Wt 161.1 lb

## 2016-02-18 DIAGNOSIS — D721 Eosinophilia, unspecified: Secondary | ICD-10-CM

## 2016-02-18 NOTE — Progress Notes (Signed)
Patient Care Team: Lavone Orn, MD as PCP - General (Internal Medicine)  DIAGNOSIS: Eosinophilia status post bone marrow biopsy.  CHIEF COMPLIANT: Follow-up to review bone marrow biopsy report  INTERVAL HISTORY: Jay Wilkins is a 75 year old with above-mentioned history of eosinophilia who underwent a bone marrow biopsy is here today to discuss the result. He does not have any pain or discomfort from the biopsy site. The pulmonary results from the biopsy revealed no evidence of leukemia or lymphoma. Reactive versus neoplastic processes still under consideration and cytogenetics and FISH panel for myeloproliferative neoplasms is pending. He has intermittent diarrhea 2 or 3 times a week.  REVIEW OF SYSTEMS:   Constitutional: Denies fevers, chills or abnormal weight loss Eyes: Denies blurriness of vision Ears, nose, mouth, throat, and face: Denies mucositis or sore throat Respiratory: Denies cough, dyspnea or wheezes Cardiovascular: Denies palpitation, chest discomfort Gastrointestinal:  Intermittent diarrhea could be due to cardiac medications Skin: Denies abnormal skin rashes Lymphatics: Denies new lymphadenopathy or easy bruising Neurological:Denies numbness, tingling or new weaknesses Behavioral/Psych: Mood is stable, no new changes  Extremities: No lower extremity edema  All other systems were reviewed with the patient and are negative.  I have reviewed the past medical history, past surgical history, social history and family history with the patient and they are unchanged from previous note.  ALLERGIES:  has No Known Allergies.  MEDICATIONS:  Current Outpatient Prescriptions  Medication Sig Dispense Refill  . acetaminophen (TYLENOL) 325 MG tablet Take 2 tablets (650 mg total) by mouth every 4 (four) hours as needed for mild pain, moderate pain, fever or headache.    . AMBULATORY NON FORMULARY MEDICATION Take 90 mg by mouth 2 (two) times daily. Medication Name: Brilinta 90  mg BID (TWILIGHT Research Study PROVIDED/do not fill )    . AMBULATORY NON FORMULARY MEDICATION Take 81 mg by mouth daily. Medication Name: ASPIRIN 81 mg Daily or PLACEBO (TWILIGHT RESEARCH STUDY PROVIDED)    . atorvastatin (LIPITOR) 40 MG tablet Take 1 tablet (40 mg total) by mouth daily at 6 PM. 90 tablet 3  . carvedilol (COREG) 3.125 MG tablet Take 1 tablet (3.125 mg total) by mouth 2 (two) times daily. 180 tablet 3  . cyclobenzaprine (FLEXERIL) 10 MG tablet Take 10 mg by mouth 3 (three) times daily as needed for muscle spasms.    Marland Kitchen esomeprazole (NEXIUM) 40 MG capsule Take 40 mg by mouth daily as needed (as needed for heartburn or indigestion).     Marland Kitchen latanoprost (XALATAN) 0.005 % ophthalmic solution Place 1 drop into both eyes at bedtime.    . Multiple Vitamins-Minerals (CENTRUM SILVER PO) Take 1 tablet by mouth daily.    . nitroGLYCERIN (NITROSTAT) 0.4 MG SL tablet Place 1 tablet (0.4 mg total) under the tongue every 5 (five) minutes as needed for chest pain. 25 tablet 4   No current facility-administered medications for this visit.    PHYSICAL EXAMINATION: ECOG PERFORMANCE STATUS: 0 - Asymptomatic  Filed Vitals:   02/18/16 1449  BP: 135/72  Pulse: 58  Temp: 98.4 F (36.9 C)  Resp: 18   Filed Weights   02/18/16 1449  Weight: 161 lb 1 oz (73.057 kg)    GENERAL:alert, no distress and comfortable SKIN: skin color, texture, turgor are normal, no rashes or significant lesions EYES: normal, Conjunctiva are pink and non-injected, sclera clear OROPHARYNX:no exudate, no erythema and lips, buccal mucosa, and tongue normal  NECK: supple, thyroid normal size, non-tender, without nodularity LYMPH:  no palpable  lymphadenopathy in the cervical, axillary or inguinal LUNGS: clear to auscultation and percussion with normal breathing effort HEART: regular rate & rhythm and no murmurs and no lower extremity edema ABDOMEN:abdomen soft, non-tender and normal bowel sounds MUSCULOSKELETAL:no  cyanosis of digits and no clubbing  NEURO: alert & oriented x 3 with fluent speech, no focal motor/sensory deficits EXTREMITIES: No lower extremity edema  LABORATORY DATA:  I have reviewed the data as listed   Chemistry      Component Value Date/Time   NA 140 10/11/2015 0450   K 4.2 10/11/2015 0450   CL 106 10/11/2015 0450   CO2 24 10/11/2015 0450   BUN 16 10/11/2015 0450   CREATININE 0.97 10/11/2015 0450   CREATININE 0.92 10/07/2015 1653      Component Value Date/Time   CALCIUM 8.7* 10/11/2015 0450       Lab Results  Component Value Date   WBC 14.0* 02/10/2016   HGB 13.9 02/10/2016   HCT 41.3 02/10/2016   MCV 99.5* 02/10/2016   PLT 210 02/10/2016   NEUTROABS 4.3 02/10/2016     ASSESSMENT & PLAN:  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 In February 2017, absolute eosinophil count was 6400 with a WBC count of 14,000. 12/04/2015: Absolute eosinophil count 5600 with a white blood cell count of 15.4  Differential diagnosis of eosinophilia 1. Infections: Patient does not have any evidence of infections. Strongiloides IgG negative, ESR normal 2. Inflammations: No clear-cut evidence of autoimmune diseases or inflammatory disease. Patient had heart catheterization February 2017 and underwent a stent placement. Patient had eosinophilia even prior to this. 3. Medications: Most of his new cardiac medications were started after he had known evidence of eosinophilia. 4. 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 5. Endocrine causes: Cortisol levels were normal. 6. Bone marrow disorders: Differential diagnosis hypereosinophilic syndrome, lymphoma, leukemia etc.  Bone marrow biopsy: 02/10/2016: Mildly hypocellular marrow with eosinophilia. No increase in dysplasia are blasts, nonspecific findings, cytogenetics pending  I discussed with the  patient the pathology report and the details of the bone marrow biopsy in great detail.  Recommendation:  1. Await the results of cytogenetics and FISH panel for myeloproliferative disease. I will call him with the results of this test. 2. No need of any specific therapy since we believe that the cause is reactive in nature. Patient will have CBC with differential done by his primary care physician 3 months. I will see the patient back in 6 months with another CBC with differential.   Orders Placed This Encounter  Procedures  . CBC with Differential    Standing Status: Future     Number of Occurrences:      Standing Expiration Date: 02/17/2017   The patient has a good understanding of the overall plan. he agrees with it. he will call with any problems that may develop before the next visit here.   Rulon Eisenmenger, MD 02/18/2016

## 2016-02-18 NOTE — Telephone Encounter (Signed)
appt made and avs printed °

## 2016-02-18 NOTE — Assessment & Plan Note (Signed)
Patient had low-grade eosinophilia in 2004 with an absolute eosinophil count of 1000. In February 2017, absolute eosinophil count was 6400 with a WBC count of 14,000. 12/04/2015: Absolute eosinophil count 5600 with a white blood cell count of 15.4  Differential diagnosis of eosinophilia 1. Infections: Patient does not have any evidence of infections. Strongiloides IgG negative, ESR normal 2. Inflammations: No clear-cut evidence of autoimmune diseases or inflammatory disease. Patient had heart catheterization February 2017 and underwent a stent placement. Patient had eosinophilia even prior to this. 3. Medications: Most of his new cardiac medications were started after he had known evidence of eosinophilia. 4. 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 5. Endocrine causes: Cortisol levels were normal. 6. Bone marrow disorders: Differential diagnosis hypereosinophilic syndrome, lymphoma, leukemia etc.  Bone marrow biopsy: 02/10/2016: Mildly hypocellular marrow with eosinophilia. No increase in dysplasia are blasts, nonspecific findings, cytogenetics pending  I discussed with the patient the pathology report and the details of the bone marrow biopsy in great detail.  Recommendation:  1. Await the results of cytogenetics and FISH panel for myeloproliferative disease  2. No need of any specific therapy since we believe that the cause is reactive in nature.

## 2016-02-19 ENCOUNTER — Encounter (HOSPITAL_COMMUNITY)
Admission: RE | Admit: 2016-02-19 | Discharge: 2016-02-19 | Disposition: A | Discharge status: Home / Self Care | Payer: Medicare Other | Source: Ambulatory Visit | Attending: Cardiovascular Disease | Admitting: Cardiovascular Disease

## 2016-02-19 DIAGNOSIS — Z955 Presence of coronary angioplasty implant and graft: Secondary | ICD-10-CM | POA: Diagnosis not present

## 2016-02-20 ENCOUNTER — Encounter: Payer: Self-pay | Admitting: *Deleted

## 2016-02-20 LAB — CHROMOSOME ANALYSIS, BONE MARROW

## 2016-02-20 LAB — TISSUE HYBRIDIZATION (BONE MARROW)-NCBH

## 2016-02-20 NOTE — Progress Notes (Signed)
Cytogenetic lab results received from [facility], reviewed by Dr. Lindi Adie, sent to scan.

## 2016-02-21 ENCOUNTER — Encounter (HOSPITAL_COMMUNITY): Payer: Self-pay

## 2016-02-21 ENCOUNTER — Encounter (HOSPITAL_COMMUNITY)
Admission: RE | Admit: 2016-02-21 | Discharge: 2016-02-21 | Disposition: A | Discharge status: Home / Self Care | Payer: Medicare Other | Source: Ambulatory Visit | Attending: Cardiovascular Disease | Admitting: Cardiovascular Disease

## 2016-02-21 VITALS — Ht 68.0 in | Wt 160.3 lb

## 2016-02-21 DIAGNOSIS — Z955 Presence of coronary angioplasty implant and graft: Secondary | ICD-10-CM

## 2016-02-27 ENCOUNTER — Telehealth: Payer: Self-pay | Admitting: Cardiovascular Disease

## 2016-02-27 ENCOUNTER — Encounter (HOSPITAL_COMMUNITY): Payer: Self-pay

## 2016-02-27 DIAGNOSIS — D721 Eosinophilia, unspecified: Secondary | ICD-10-CM

## 2016-02-27 NOTE — Telephone Encounter (Signed)
New Message  Pt call requesting to speak with RN. Pt called to schedule f/u appt, none avail. Pt states he is to have blood work done and wants the appt to be in September, to get the results before ov with primary care doctor. Please call back to discuss

## 2016-02-27 NOTE — Telephone Encounter (Signed)
Spoke with patient who called to schedule September follow-up appointment with Dr. Acie Fredrickson and appointment for lab work.  I scheduled patient to see Dr. Acie Fredrickson on 9/14 and for labs on 9/12.  He is aware that we have ordered PSA and CBC w/diff in addition to lipid panel and cmet so that results can be sent to PCP.  He thanked me for my help.

## 2016-02-28 ENCOUNTER — Other Ambulatory Visit: Payer: Self-pay

## 2016-02-28 DIAGNOSIS — D721 Eosinophilia, unspecified: Secondary | ICD-10-CM

## 2016-02-28 DIAGNOSIS — F4323 Adjustment disorder with mixed anxiety and depressed mood: Secondary | ICD-10-CM | POA: Diagnosis not present

## 2016-02-28 NOTE — Progress Notes (Signed)
Referral entered for ID consult per Dr. Geralyn Flash request.

## 2016-03-16 DIAGNOSIS — F4323 Adjustment disorder with mixed anxiety and depressed mood: Secondary | ICD-10-CM | POA: Diagnosis not present

## 2016-03-17 ENCOUNTER — Telehealth: Payer: Self-pay

## 2016-03-18 NOTE — Telephone Encounter (Signed)
I returned call to pt re: referral.  I let pt know U sent referral to Dr Johnnye Sima.   If he does not hear from them within a week he should call us at the clinic.  Pt voiced understanding.

## 2016-04-07 DIAGNOSIS — F4323 Adjustment disorder with mixed anxiety and depressed mood: Secondary | ICD-10-CM | POA: Diagnosis not present

## 2016-04-09 ENCOUNTER — Encounter: Payer: Self-pay | Admitting: Cardiovascular Disease

## 2016-04-21 DIAGNOSIS — H40012 Open angle with borderline findings, low risk, left eye: Secondary | ICD-10-CM | POA: Diagnosis not present

## 2016-04-21 DIAGNOSIS — H40011 Open angle with borderline findings, low risk, right eye: Secondary | ICD-10-CM | POA: Diagnosis not present

## 2016-04-28 ENCOUNTER — Encounter: Payer: Self-pay | Admitting: Infectious Disease

## 2016-04-28 ENCOUNTER — Ambulatory Visit (INDEPENDENT_AMBULATORY_CARE_PROVIDER_SITE_OTHER): Payer: Medicare Other | Admitting: Infectious Disease

## 2016-04-28 VITALS — BP 113/73 | HR 69 | Temp 98.7°F | Ht 67.5 in | Wt 161.0 lb

## 2016-04-28 DIAGNOSIS — Z113 Encounter for screening for infections with a predominantly sexual mode of transmission: Secondary | ICD-10-CM | POA: Diagnosis not present

## 2016-04-28 DIAGNOSIS — J438 Other emphysema: Secondary | ICD-10-CM

## 2016-04-28 DIAGNOSIS — R059 Cough, unspecified: Secondary | ICD-10-CM

## 2016-04-28 DIAGNOSIS — R509 Fever, unspecified: Secondary | ICD-10-CM

## 2016-04-28 DIAGNOSIS — R05 Cough: Secondary | ICD-10-CM | POA: Diagnosis not present

## 2016-04-28 DIAGNOSIS — D721 Eosinophilia, unspecified: Secondary | ICD-10-CM

## 2016-04-28 DIAGNOSIS — Z7251 High risk heterosexual behavior: Secondary | ICD-10-CM | POA: Diagnosis not present

## 2016-04-28 DIAGNOSIS — D72829 Elevated white blood cell count, unspecified: Secondary | ICD-10-CM | POA: Diagnosis not present

## 2016-04-28 DIAGNOSIS — K219 Gastro-esophageal reflux disease without esophagitis: Secondary | ICD-10-CM

## 2016-04-28 DIAGNOSIS — R911 Solitary pulmonary nodule: Secondary | ICD-10-CM | POA: Insufficient documentation

## 2016-04-28 HISTORY — DX: Solitary pulmonary nodule: R91.1

## 2016-04-28 HISTORY — DX: Elevated white blood cell count, unspecified: D72.829
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
## 2016-04-29 LAB — HEPATITIS C ANTIBODY: HCV Ab: NEGATIVE

## 2016-04-29 LAB — CK: Total CK: 53 U/L (ref 7–232)

## 2016-04-29 LAB — ANGIOTENSIN CONVERTING ENZYME: Angiotensin-Converting Enzyme: 39 U/L (ref 8–52)

## 2016-04-29 LAB — HEPATITIS B SURFACE ANTIGEN: Hepatitis B Surface Ag: NEGATIVE

## 2016-04-29 LAB — HIV ANTIBODY (ROUTINE TESTING W REFLEX): HIV 1&2 Ab, 4th Generation: NONREACTIVE

## 2016-04-30 DIAGNOSIS — D721 Eosinophilia: Secondary | ICD-10-CM | POA: Diagnosis not present

## 2016-04-30 LAB — HTLV I+II ANTIBODIES, (EIA), BLD: HTLV I/II Antibody: NONREACTIVE

## 2016-04-30 LAB — QUANTIFERON TB GOLD ASSAY (BLOOD)
Interferon Gamma Release Assay: NEGATIVE
Mitogen-Nil: 6.44 IU/mL
Quantiferon Nil Value: 0.02 IU/mL
Quantiferon Tb Ag Minus Nil Value: 0 IU/mL

## 2016-05-01 LAB — OVA AND PARASITE EXAMINATION: OP: NONE SEEN

## 2016-05-01 NOTE — Progress Notes (Signed)
No problem. I am not sure what is driving his eosinophilia. It would be nice to have another target to biopsy

## 2016-05-04 DIAGNOSIS — R112 Nausea with vomiting, unspecified: Secondary | ICD-10-CM | POA: Diagnosis not present

## 2016-05-04 DIAGNOSIS — R05 Cough: Secondary | ICD-10-CM | POA: Diagnosis not present

## 2016-05-05 ENCOUNTER — Other Ambulatory Visit: Payer: Medicare Other

## 2016-05-07 ENCOUNTER — Ambulatory Visit: Payer: Medicare Other | Admitting: Cardiovascular Disease

## 2016-05-12 ENCOUNTER — Other Ambulatory Visit: Payer: Medicare Other | Admitting: *Deleted

## 2016-05-12 DIAGNOSIS — D721 Eosinophilia, unspecified: Secondary | ICD-10-CM

## 2016-05-12 DIAGNOSIS — I2510 Atherosclerotic heart disease of native coronary artery without angina pectoris: Secondary | ICD-10-CM

## 2016-05-12 DIAGNOSIS — E785 Hyperlipidemia, unspecified: Secondary | ICD-10-CM | POA: Diagnosis not present

## 2016-05-12 DIAGNOSIS — Z8546 Personal history of malignant neoplasm of prostate: Secondary | ICD-10-CM | POA: Diagnosis not present

## 2016-05-12 LAB — CBC WITH DIFFERENTIAL/PLATELET
Basophils Absolute: 0 cells/uL (ref 0–200)
Basophils Relative: 0 %
Eosinophils Absolute: 5831 cells/uL — ABNORMAL HIGH (ref 15–500)
Eosinophils Relative: 49 %
HCT: 43.4 % (ref 38.5–50.0)
Hemoglobin: 14.9 g/dL (ref 13.2–17.1)
Lymphocytes Relative: 16 %
Lymphs Abs: 1904 cells/uL (ref 850–3900)
MCH: 33.6 pg — ABNORMAL HIGH (ref 27.0–33.0)
MCHC: 34.3 g/dL (ref 32.0–36.0)
MCV: 98 fL (ref 80.0–100.0)
MPV: 10.1 fL (ref 7.5–12.5)
Monocytes Absolute: 714 cells/uL (ref 200–950)
Monocytes Relative: 6 %
Neutro Abs: 3451 cells/uL (ref 1500–7800)
Neutrophils Relative %: 29 %
Platelets: 293 10*3/uL (ref 140–400)
RBC: 4.43 MIL/uL (ref 4.20–5.80)
RDW: 13.9 % (ref 11.0–15.0)
WBC: 11.9 10*3/uL — ABNORMAL HIGH (ref 3.8–10.8)

## 2016-05-12 LAB — LIPID PANEL
Cholesterol: 138 mg/dL (ref 125–200)
HDL: 75 mg/dL (ref >=40)
LDL Cholesterol: 53 mg/dL (ref <130)
Total CHOL/HDL Ratio: 1.8 Ratio (ref <=5.0)
Triglycerides: 52 mg/dL (ref <150)
VLDL: 10 mg/dL (ref <30)

## 2016-05-12 LAB — COMPREHENSIVE METABOLIC PANEL
ALT: 11 U/L (ref 9–46)
AST: 17 U/L (ref 10–35)
Albumin: 3.8 g/dL (ref 3.6–5.1)
Alkaline Phosphatase: 59 U/L (ref 40–115)
BUN: 16 mg/dL (ref 7–25)
CO2: 25 mmol/L (ref 20–31)
Calcium: 8.8 mg/dL (ref 8.6–10.3)
Chloride: 97 mmol/L — ABNORMAL LOW (ref 98–110)
Creat: 0.98 mg/dL (ref 0.70–1.18)
Glucose, Bld: 93 mg/dL (ref 65–99)
Potassium: 4.4 mmol/L (ref 3.5–5.3)
Sodium: 134 mmol/L — ABNORMAL LOW (ref 135–146)
Total Bilirubin: 1.1 mg/dL (ref 0.2–1.2)
Total Protein: 6.2 g/dL (ref 6.1–8.1)

## 2016-05-12 LAB — PSA: PSA: 0.1 ng/mL (ref <=4.0)

## 2016-05-14 ENCOUNTER — Ambulatory Visit (INDEPENDENT_AMBULATORY_CARE_PROVIDER_SITE_OTHER): Payer: Medicare Other | Admitting: Cardiovascular Disease

## 2016-05-14 ENCOUNTER — Encounter: Payer: Self-pay | Admitting: Cardiovascular Disease

## 2016-05-14 VITALS — BP 96/64 | HR 58 | Ht 67.5 in | Wt 160.8 lb

## 2016-05-14 DIAGNOSIS — I10 Essential (primary) hypertension: Secondary | ICD-10-CM | POA: Diagnosis not present

## 2016-05-14 DIAGNOSIS — I2510 Atherosclerotic heart disease of native coronary artery without angina pectoris: Secondary | ICD-10-CM | POA: Diagnosis not present

## 2016-05-14 NOTE — Patient Instructions (Signed)
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
## 2016-05-20 ENCOUNTER — Other Ambulatory Visit: Payer: Self-pay | Admitting: Internal Medicine

## 2016-05-20 DIAGNOSIS — Z23 Encounter for immunization: Secondary | ICD-10-CM | POA: Diagnosis not present

## 2016-05-20 DIAGNOSIS — J449 Chronic obstructive pulmonary disease, unspecified: Secondary | ICD-10-CM | POA: Diagnosis not present

## 2016-05-20 DIAGNOSIS — E7800 Pure hypercholesterolemia, unspecified: Secondary | ICD-10-CM | POA: Diagnosis not present

## 2016-05-20 DIAGNOSIS — Z125 Encounter for screening for malignant neoplasm of prostate: Secondary | ICD-10-CM | POA: Diagnosis not present

## 2016-05-20 DIAGNOSIS — F17211 Nicotine dependence, cigarettes, in remission: Secondary | ICD-10-CM | POA: Diagnosis not present

## 2016-05-20 DIAGNOSIS — Z87891 Personal history of nicotine dependence: Secondary | ICD-10-CM | POA: Diagnosis not present

## 2016-05-20 DIAGNOSIS — Z1389 Encounter for screening for other disorder: Secondary | ICD-10-CM | POA: Diagnosis not present

## 2016-05-20 DIAGNOSIS — D721 Eosinophilia: Secondary | ICD-10-CM | POA: Diagnosis not present

## 2016-05-20 DIAGNOSIS — Z0000 Encounter for general adult medical examination without abnormal findings: Secondary | ICD-10-CM | POA: Diagnosis not present

## 2016-05-20 DIAGNOSIS — I2510 Atherosclerotic heart disease of native coronary artery without angina pectoris: Secondary | ICD-10-CM | POA: Diagnosis not present

## 2016-05-21 ENCOUNTER — Other Ambulatory Visit: Payer: Self-pay | Admitting: Internal Medicine

## 2016-05-21 DIAGNOSIS — F17211 Nicotine dependence, cigarettes, in remission: Secondary | ICD-10-CM

## 2016-05-27 NOTE — Progress Notes (Addendum)
[facility] PREP Weekly Session   Patient Details  Name: Jay Wilkins MRN: [id number] Date of Birth: June 01, 1941 Age: 75 y.o. PCP: Irven Shelling, MD  Vitals:   05/27/16 1237  Weight: 160 lb (72.6 kg)        [name] Weekly seesion - 05/27/16 1200      Weekly Session   Topic Discussed Hitting roadblocks   Minutes exercised this week 250 minutes  150cardio/100strength   Classes attended to date 12   Comments eating watermelon as healthy dessert         [name] 05/27/2016, 12:52 PM

## 2016-06-03 NOTE — Progress Notes (Signed)
[facility] PREP Weekly Session   Patient Details  Name: Jay Wilkins MRN: [id number] Date of Birth: 04/19/1941 Age: 75 y.o. PCP: Irven Shelling, MD  Vitals:   06/03/16 1256  Weight: 160 lb (72.6 kg)        [name] Weekly seesion - 06/03/16 1200      Weekly Session   Topic Discussed Water   Minutes exercised this week 75 minutes  30 cardio/45strength   Classes attended to date 62   Comments has been traveling and not able to exercise like normal       [name] 06/03/2016, 12:57 PM

## 2016-06-08 ENCOUNTER — Ambulatory Visit (INDEPENDENT_AMBULATORY_CARE_PROVIDER_SITE_OTHER): Payer: Medicare Other | Admitting: Infectious Disease

## 2016-06-08 ENCOUNTER — Encounter: Payer: Self-pay | Admitting: Infectious Disease

## 2016-06-08 VITALS — BP 121/77 | HR 68 | Temp 97.7°F | Wt 159.0 lb

## 2016-06-08 DIAGNOSIS — E785 Hyperlipidemia, unspecified: Secondary | ICD-10-CM

## 2016-06-08 DIAGNOSIS — K219 Gastro-esophageal reflux disease without esophagitis: Secondary | ICD-10-CM | POA: Diagnosis not present

## 2016-06-08 DIAGNOSIS — I2510 Atherosclerotic heart disease of native coronary artery without angina pectoris: Secondary | ICD-10-CM

## 2016-06-08 DIAGNOSIS — I10 Essential (primary) hypertension: Secondary | ICD-10-CM

## 2016-06-08 DIAGNOSIS — D721 Eosinophilia, unspecified: Secondary | ICD-10-CM

## 2016-06-08 DIAGNOSIS — J438 Other emphysema: Secondary | ICD-10-CM | POA: Diagnosis not present

## 2016-06-08 DIAGNOSIS — R0609 Other forms of dyspnea: Secondary | ICD-10-CM

## 2016-06-08 DIAGNOSIS — R0600 Dyspnea, unspecified: Secondary | ICD-10-CM

## 2016-06-08 DIAGNOSIS — Z9861 Coronary angioplasty status: Secondary | ICD-10-CM | POA: Diagnosis not present

## 2016-06-08 DIAGNOSIS — D7219 Other eosinophilia: Secondary | ICD-10-CM

## 2016-06-08 DIAGNOSIS — R911 Solitary pulmonary nodule: Secondary | ICD-10-CM

## 2016-06-08 DIAGNOSIS — I255 Ischemic cardiomyopathy: Secondary | ICD-10-CM | POA: Diagnosis not present
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
## 2016-06-09 LAB — RHEUMATOID FACTOR: Rhuematoid fact SerPl-aCnc: <14 IU/mL (ref <14)

## 2016-06-09 LAB — IGE: IgE (Immunoglobulin E), Serum: 56 kU/L (ref <115)

## 2016-06-09 LAB — IGG, IGA, IGM
IgA: 290 mg/dL (ref 81–463)
IgG (Immunoglobin G), Serum: 910 mg/dL (ref 694–1618)
IgM, Serum: 78 mg/dL (ref 48–271)

## 2016-06-09 LAB — CYCLIC CITRUL PEPTIDE ANTIBODY, IGG: Cyclic Citrullin Peptide Ab: <16 Units

## 2016-06-09 LAB — C3 AND C4
C3 Complement: 109 mg/dL (ref 90–180)
C4 Complement: 22 mg/dL (ref 16–47)

## 2016-06-09 LAB — ANCA SCREEN W REFLEX TITER: ANCA Screen: NEGATIVE

## 2016-06-11 LAB — COMPLEMENT, TOTAL: Compl, Total (CH50): 60 U/mL (ref 31–60)

## 2016-06-17 DIAGNOSIS — Z23 Encounter for immunization: Secondary | ICD-10-CM | POA: Diagnosis not present

## 2016-06-17 NOTE — Progress Notes (Signed)
[facility] PREP Weekly Session   Patient Details  Name: Jay Wilkins MRN: [id number] Date of Birth: 1941-03-18 Age: 75 y.o. PCP: Irven Shelling, MD  Vitals:   06/17/16 1315  Weight: 161 lb (73 kg)        [name] Weekly seesion - 06/17/16 1300      Weekly Session   Topic Discussed Other  fat and calorie detective   Minutes exercised this week 202 minutes  122 cardio/80 strength   Classes attended to date 69   Comments "grateful for family health and safe travel to [place] for wedding."  "Struggled with time for cardio while traveling"       [name] 06/17/2016, 1:17 PM

## 2016-07-09 ENCOUNTER — Telehealth: Payer: Self-pay | Admitting: Hematology and Oncology

## 2016-07-09 NOTE — Telephone Encounter (Signed)
Appointments rescheduled per patient request.  °

## 2016-07-14 DIAGNOSIS — J449 Chronic obstructive pulmonary disease, unspecified: Secondary | ICD-10-CM | POA: Diagnosis not present

## 2016-07-14 DIAGNOSIS — J387 Other diseases of larynx: Secondary | ICD-10-CM | POA: Diagnosis not present

## 2016-07-14 DIAGNOSIS — J029 Acute pharyngitis, unspecified: Secondary | ICD-10-CM | POA: Diagnosis not present

## 2016-07-27 ENCOUNTER — Encounter: Payer: Self-pay | Admitting: *Deleted

## 2016-07-27 DIAGNOSIS — Z006 Encounter for examination for normal comparison and control in clinical research program: Secondary | ICD-10-CM

## 2016-07-27 NOTE — Progress Notes (Signed)
TWILIGHT Research study month 9 office visit completed. Patient denies any bleeding events or other adverse events. He returns study medication for compliance check. He has been 97.51 % compliant with both ASA/Placebo and Brilinta. He was dispensed the following pill bottle #'s [id number]. His next research required visit is due no later than 11/JUN/2018. At this visit he will no longer be provided ASA/Placebo or Brilinta and any further antiplatelet therapy will be at the discretion of his cardiologist. Questions encouraged and answered.

## 2016-08-03 ENCOUNTER — Encounter (HOSPITAL_COMMUNITY): Payer: Self-pay

## 2016-08-04 ENCOUNTER — Telehealth: Payer: Self-pay | Admitting: Cardiovascular Disease

## 2016-08-04 NOTE — Telephone Encounter (Signed)
Left message to call back  

## 2016-08-04 NOTE — Telephone Encounter (Signed)
New message     Pt c/o BP issue: STAT if pt c/o blurred vision, one-sided weakness or slurred speech  1. What are your last 5 BP readings? Yesterday 135/78, 148/78 hr 65, 150/83, 139/78 (30 min after cardio work out) 2. Are you having any other symptoms (ex. Dizziness, headache, blurred vision, passed out)? Occasional upper chest (left and right side) discomfort for a short time 3. What is your BP issue?  Calling to let doctor know that last thurs/fri his heart rate with exercise was 130, yesterday HR with exercise was 110-120 and today with exercise it was in the 90's.  Normally his HR is in the 80's. Also yesterday bp was elevated.  Pt thought Dr Acie Fredrickson should know. Also, pt is scheduled for a routine CAT scan tomorrow am.

## 2016-08-05 ENCOUNTER — Other Ambulatory Visit: Payer: Medicare Other

## 2016-08-05 ENCOUNTER — Ambulatory Visit
Admission: RE | Admit: 2016-08-05 | Discharge: 2016-08-05 | Disposition: A | Discharge status: Home / Self Care | Payer: Medicare Other | Source: Ambulatory Visit | Attending: Internal Medicine | Admitting: Internal Medicine

## 2016-08-05 ENCOUNTER — Ambulatory Visit: Payer: Medicare Other | Admitting: Hematology and Oncology

## 2016-08-05 DIAGNOSIS — F17211 Nicotine dependence, cigarettes, in remission: Secondary | ICD-10-CM

## 2016-08-05 DIAGNOSIS — Z87891 Personal history of nicotine dependence: Secondary | ICD-10-CM | POA: Diagnosis not present

## 2016-08-05 NOTE — Telephone Encounter (Signed)
Spoke with patient and advised him that Dr. Acie Fredrickson has reviewed the BP and pulse readings and feels that these are normal when associated with exercise.  Patient denies chest discomfort during work out; states he has had some discomfort in upper chest near his shoulders a day or 2 after working out.  I advised this sounds more like MSK pain and to keep the NTG on hand to take if pain is associated with exercise.  He verbalized understanding and agreement.  We discussed keeping a BP/pulse log and taking the readings either 30 min after medication and before exercise or several hours after exercise.  He states he will call back if readings are abnormal.  He verbalized understanding and agreement with plan and thanked me for the call.

## 2016-08-05 NOTE — Telephone Encounter (Signed)
Agree with note from Michelle Swinyer, RN  

## 2016-08-05 NOTE — Telephone Encounter (Signed)
Patient returned call and will be home today

## 2016-08-05 NOTE — Assessment & Plan Note (Signed)
Patient had low-grade eosinophilia in 2004 with an absolute eosinophil count of 1000. In February 2017, absolute eosinophil count was 6400 with a WBC count of 14,000. 12/04/2015: Absolute eosinophil count 5600 with a white blood cell count of 15.4  Differential diagnosis of eosinophilia 1. Infections: Patient does not have any evidence of infections. Strongiloides IgG negative, ESR normal 2. Inflammations: No clear-cut evidence of autoimmune diseases or inflammatory disease. Patient had heart catheterization February 2017 and underwent a stent placement. Patient had eosinophilia even prior to this. 3. Medications: Most of his new cardiac medications were started after he had known evidence of eosinophilia. 4. 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 5. Endocrine causes: Cortisol levels were normal. 6. Bone marrow disorders: Differential diagnosis hypereosinophilic syndrome, lymphoma, leukemia etc.  Bone marrow biopsy: 02/10/2016: Mildly hypocellular marrow with eosinophilia. No increase in dysplasia are blasts, nonspecific findings, cytogenetics pending  I discussed with the patient the pathology report and the details of the bone marrow biopsy in great detail.  Recommendation:  1. Await the results of cytogenetics and FISH panel for myeloproliferative disease. I will call him with the results of this test. 2. No need of any specific therapy since we believe that the cause is reactive in nature. Patient will have CBC with differential done by his primary care physician 3 months. I will see the patient back in 6 months with another CBC with differential.

## 2016-08-06 ENCOUNTER — Other Ambulatory Visit (HOSPITAL_BASED_OUTPATIENT_CLINIC_OR_DEPARTMENT_OTHER): Payer: Medicare Other

## 2016-08-06 ENCOUNTER — Ambulatory Visit (HOSPITAL_BASED_OUTPATIENT_CLINIC_OR_DEPARTMENT_OTHER): Payer: Medicare Other | Admitting: Hematology and Oncology

## 2016-08-06 ENCOUNTER — Encounter: Payer: Self-pay | Admitting: Hematology and Oncology

## 2016-08-06 DIAGNOSIS — D721 Eosinophilia, unspecified: Secondary | ICD-10-CM

## 2016-08-06 LAB — CBC WITH DIFFERENTIAL/PLATELET
BASO%: 0.6 % (ref 0.0–2.0)
Basophils Absolute: 0.1 10*3/uL (ref 0.0–0.1)
EOS%: 47.7 % — ABNORMAL HIGH (ref 0.0–7.0)
Eosinophils Absolute: 5.3 10*3/uL — ABNORMAL HIGH (ref 0.0–0.5)
HCT: 42.7 % (ref 38.4–49.9)
HGB: 14.2 g/dL (ref 13.0–17.1)
LYMPH%: 11.4 % — ABNORMAL LOW (ref 14.0–49.0)
MCH: 33.4 pg (ref 27.2–33.4)
MCHC: 33.2 g/dL (ref 32.0–36.0)
MCV: 100.8 fL — ABNORMAL HIGH (ref 79.3–98.0)
MONO#: 0.7 10*3/uL (ref 0.1–0.9)
MONO%: 6.6 % (ref 0.0–14.0)
NEUT#: 3.7 10*3/uL (ref 1.5–6.5)
NEUT%: 33.7 % — ABNORMAL LOW (ref 39.0–75.0)
Platelets: 224 10*3/uL (ref 140–400)
RBC: 4.23 10*6/uL (ref 4.20–5.82)
RDW: 14.1 % (ref 11.0–14.6)
WBC: 11.1 10*3/uL — ABNORMAL HIGH (ref 4.0–10.3)
lymph#: 1.3 10*3/uL (ref 0.9–3.3)

## 2016-08-06 NOTE — Progress Notes (Signed)
Patient Care Team: Lavone Orn, MD as PCP - General (Internal Medicine)  DIAGNOSIS:  Encounter Diagnosis  Name Primary?  . Eosinophilia    CHIEF COMPLIANT: Follow-up of eosinophilia  INTERVAL HISTORY: Jay Wilkins is a 75 year old With history of a stuffy who is currently on surveillance and observation. We had an extensive workup including a bone marrow biopsy and ruled out any absolute eosinophilic syndrome or CML. He had a negative by chromosome in 70% of his cells which could be age-related. There was no evidence of hypereosinophilic syndrome on the molecular testing. He is completely asymptomatic from this no failure and has had stable eosinophil counts. He complains of sinus congestion and postnasal drip for which he is taking Flonase currently.  REVIEW OF SYSTEMS:   Constitutional: Denies fevers, chills or abnormal weight loss Eyes: Denies blurriness of vision Ears, nose, mouth, throat, and face: Denies mucositis or sore throat Respiratory: Denies cough, dyspnea or wheezes Cardiovascular: Denies palpitation, chest discomfort Gastrointestinal:  Denies nausea, heartburn or change in bowel habits Skin: Denies abnormal skin rashes Lymphatics: Denies new lymphadenopathy or easy bruising Neurological:Denies numbness, tingling or new weaknesses Behavioral/Psych: Mood is stable, no new changes  Extremities: No lower extremity edema All other systems were reviewed with the patient and are negative.  I have reviewed the past medical history, past surgical history, social history and family history with the patient and they are unchanged from previous note.  ALLERGIES:  has No Known Allergies.  MEDICATIONS:  Current Outpatient Prescriptions  Medication Sig Dispense Refill  . acetaminophen (TYLENOL) 325 MG tablet Take 2 tablets (650 mg total) by mouth every 4 (four) hours as needed for mild pain, moderate pain, fever or headache.    . AMBULATORY NON FORMULARY MEDICATION Take 90 mg  by mouth 2 (two) times daily. Medication Name: Brilinta 90 mg BID (TWILIGHT Research Study PROVIDED/do not fill )    . AMBULATORY NON FORMULARY MEDICATION Take 81 mg by mouth daily. Medication Name: ASPIRIN 81 mg Daily or PLACEBO (TWILIGHT RESEARCH STUDY PROVIDED)    . atorvastatin (LIPITOR) 40 MG tablet Take 1 tablet (40 mg total) by mouth daily at 6 PM. 90 tablet 3  . carvedilol (COREG) 3.125 MG tablet Take 1 tablet (3.125 mg total) by mouth 2 (two) times daily. 180 tablet 3  . cyclobenzaprine (FLEXERIL) 10 MG tablet Take 10 mg by mouth 3 (three) times daily as needed for muscle spasms.    Marland Kitchen esomeprazole (NEXIUM) 40 MG capsule Take 40 mg by mouth daily as needed (as needed for heartburn or indigestion).     Marland Kitchen latanoprost (XALATAN) 0.005 % ophthalmic solution Place 1 drop into both eyes at bedtime.    . Multiple Vitamins-Minerals (CENTRUM SILVER PO) Take 1 tablet by mouth daily.    . nitroGLYCERIN (NITROSTAT) 0.4 MG SL tablet Place 1 tablet (0.4 mg total) under the tongue every 5 (five) minutes as needed for chest pain. 25 tablet 4   No current facility-administered medications for this visit.     PHYSICAL EXAMINATION: ECOG PERFORMANCE STATUS: 1 - Symptomatic but completely ambulatory  There were no vitals filed for this visit. There were no vitals filed for this visit.  GENERAL:alert, no distress and comfortable SKIN: skin color, texture, turgor are normal, no rashes or significant lesions EYES: normal, Conjunctiva are pink and non-injected, sclera clear OROPHARYNX:no exudate, no erythema and lips, buccal mucosa, and tongue normal  NECK: supple, thyroid normal size, non-tender, without nodularity LYMPH:  no palpable lymphadenopathy in the  cervical, axillary or inguinal LUNGS: clear to auscultation and percussion with normal breathing effort HEART: regular rate & rhythm and no murmurs and no lower extremity edema ABDOMEN:abdomen soft, non-tender and normal bowel sounds MUSCULOSKELETAL:no  cyanosis of digits and no clubbing  NEURO: alert & oriented x 3 with fluent speech, no focal motor/sensory deficits EXTREMITIES: No lower extremity edema  LABORATORY DATA:  I have reviewed the data as listed   Chemistry      Component Value Date/Time   NA 134 (L) 05/12/2016 0915   K 4.4 05/12/2016 0915   CL 97 (L) 05/12/2016 0915   CO2 25 05/12/2016 0915   BUN 16 05/12/2016 0915   CREATININE 0.98 05/12/2016 0915      Component Value Date/Time   CALCIUM 8.8 05/12/2016 0915   ALKPHOS 59 05/12/2016 0915   AST 17 05/12/2016 0915   ALT 11 05/12/2016 0915   BILITOT 1.1 05/12/2016 0915       Lab Results  Component Value Date   WBC 11.9 (H) 05/12/2016   HGB 14.9 05/12/2016   HCT 43.4 05/12/2016   MCV 98.0 05/12/2016   PLT 293 05/12/2016   NEUTROABS 3,451 05/12/2016    ASSESSMENT & PLAN:  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 In February 2017, absolute eosinophil count was 6400 with a WBC count of 14,000. 12/04/2015: Absolute eosinophil count 5600 with a white blood cell count of 15.4 05/12/2016: Absolute eosinophil count 5831with a WBC count of 11.9 08/06/2016: Absolute eosinophil count 5300 with a white blood cell count of 11.9  Differential diagnosis of eosinophilia 1. Infections: Patient does not have any evidence of infections. Strongiloides IgG negative, ESR normal 2. Inflammations: No clear-cut evidence of autoimmune diseases or inflammatory disease. Patient had heart catheterization February 2017 and underwent a stent placement. Patient had eosinophilia even prior to this. 3. Medications: Most of his new cardiac medications were started after he had known evidence of eosinophilia. 4. 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 5. Endocrine causes: Cortisol levels were normal. 6. Bone marrow disorders: Differential diagnosis  hypereosinophilic syndrome, lymphoma, leukemia etc.  Bone marrow biopsy: 02/10/2016: Mildly hypocellular marrow with eosinophilia. No increase in dysplasia are blasts, nonspecific findings, cytogenetics Revealed -Y in 70% of his cells. This could be age-related versus underlying clonal abnormality.  Recommendation:  1. No need of any specific therapy since we believe that the cause is reactive in nature. 2. lab review: Absolute eosinophil count 08/06/2016: 5300   I discussed with the patient that he could be seen on an annual basis. He informed me that he would like to follow with his primary care physician and his cardiologist. If there was a change in his eosinophil count he would call us and make an appointment to see me. We will see the patient on an as-needed basis.  No orders of the defined types were placed in this encounter.  The patient has a good understanding of the overall plan. he agrees with it. he will call with any problems that may develop before the next visit here.   Rulon Eisenmenger, MD 08/06/16

## 2016-08-07 DIAGNOSIS — F4323 Adjustment disorder with mixed anxiety and depressed mood: Secondary | ICD-10-CM | POA: Diagnosis not present

## 2016-08-31 ENCOUNTER — Telehealth: Payer: Self-pay | Admitting: Cardiovascular Disease

## 2016-08-31 NOTE — Telephone Encounter (Signed)
Reviewed Dr. Elmarie Shiley advice with patient who verbalized understanding and agreement and thanked me for the call.

## 2016-08-31 NOTE — Telephone Encounter (Signed)
New message      Pt had a chest CT ordered by PCP--Dr Griffin--on 08-05-16.  Patient want Dr Acie Fredrickson to look at the CT and see if he needs to see Dr Acie Fredrickson sooner than march appt. Please call

## 2016-08-31 NOTE — Telephone Encounter (Signed)
This CT does not provide any new information that we did not already have.  He has known CAD ( which is what the coronary calcifications suggest)

## 2016-09-02 DIAGNOSIS — F4323 Adjustment disorder with mixed anxiety and depressed mood: Secondary | ICD-10-CM | POA: Diagnosis not present

## 2016-09-16 DIAGNOSIS — F4323 Adjustment disorder with mixed anxiety and depressed mood: Secondary | ICD-10-CM | POA: Diagnosis not present

## 2016-10-08 DIAGNOSIS — F4323 Adjustment disorder with mixed anxiety and depressed mood: Secondary | ICD-10-CM | POA: Diagnosis not present

## 2016-10-12 DIAGNOSIS — F4323 Adjustment disorder with mixed anxiety and depressed mood: Secondary | ICD-10-CM | POA: Diagnosis not present

## 2016-10-19 DIAGNOSIS — H35372 Puckering of macula, left eye: Secondary | ICD-10-CM | POA: Diagnosis not present

## 2016-10-19 DIAGNOSIS — H40013 Open angle with borderline findings, low risk, bilateral: Secondary | ICD-10-CM | POA: Diagnosis not present

## 2016-10-19 DIAGNOSIS — H52203 Unspecified astigmatism, bilateral: Secondary | ICD-10-CM | POA: Diagnosis not present

## 2016-10-19 DIAGNOSIS — H349 Unspecified retinal vascular occlusion: Secondary | ICD-10-CM | POA: Diagnosis not present

## 2016-10-21 ENCOUNTER — Telehealth: Payer: Self-pay | Admitting: Cardiovascular Disease

## 2016-10-21 NOTE — Telephone Encounter (Signed)
Note not needed 

## 2016-10-22 ENCOUNTER — Other Ambulatory Visit: Payer: Medicare Other | Admitting: *Deleted

## 2016-10-22 DIAGNOSIS — I2510 Atherosclerotic heart disease of native coronary artery without angina pectoris: Secondary | ICD-10-CM

## 2016-10-22 NOTE — Addendum Note (Signed)
Addended by: Eulis Foster on: 10/22/2016 09:06 AM   Modules accepted: Orders

## 2016-10-23 ENCOUNTER — Ambulatory Visit (INDEPENDENT_AMBULATORY_CARE_PROVIDER_SITE_OTHER): Payer: Medicare Other | Admitting: Cardiovascular Disease

## 2016-10-23 ENCOUNTER — Encounter: Payer: Self-pay | Admitting: Cardiovascular Disease

## 2016-10-23 VITALS — BP 122/68 | HR 67 | Ht 67.5 in | Wt 163.2 lb

## 2016-10-23 DIAGNOSIS — Z9861 Coronary angioplasty status: Secondary | ICD-10-CM | POA: Diagnosis not present

## 2016-10-23 DIAGNOSIS — D721 Eosinophilia, unspecified: Secondary | ICD-10-CM

## 2016-10-23 DIAGNOSIS — I2510 Atherosclerotic heart disease of native coronary artery without angina pectoris: Secondary | ICD-10-CM

## 2016-10-23 LAB — CBC WITH DIFFERENTIAL/PLATELET
Basophils Absolute: 0 10*3/uL (ref 0.0–0.2)
Basos: 0 %
EOS (ABSOLUTE): 2 10*3/uL — ABNORMAL HIGH (ref 0.0–0.4)
Eos: 24 %
Hematocrit: 41.1 % (ref 37.5–51.0)
Hemoglobin: 14.2 g/dL (ref 13.0–17.7)
Immature Grans (Abs): 0 10*3/uL (ref 0.0–0.1)
Immature Granulocytes: 0 %
Lymphocytes Absolute: 1.6 10*3/uL (ref 0.7–3.1)
Lymphs: 20 %
MCH: 34.2 pg — ABNORMAL HIGH (ref 26.6–33.0)
MCHC: 34.5 g/dL (ref 31.5–35.7)
MCV: 99 fL — ABNORMAL HIGH (ref 79–97)
Monocytes Absolute: 0.9 10*3/uL (ref 0.1–0.9)
Monocytes: 10 %
Neutrophils Absolute: 3.7 10*3/uL (ref 1.4–7.0)
Neutrophils: 46 %
Platelets: 239 10*3/uL (ref 150–379)
RBC: 4.15 x10E6/uL (ref 4.14–5.80)
RDW: 14.6 % (ref 12.3–15.4)
WBC: 8.2 10*3/uL (ref 3.4–10.8)

## 2016-10-23 LAB — COMPREHENSIVE METABOLIC PANEL
ALT: 17 IU/L (ref 0–44)
AST: 20 IU/L (ref 0–40)
Albumin/Globulin Ratio: 1.9 (ref 1.2–2.2)
Albumin: 3.9 g/dL (ref 3.5–4.8)
Alkaline Phosphatase: 43 IU/L (ref 39–117)
BUN/Creatinine Ratio: 19 (ref 10–24)
BUN: 18 mg/dL (ref 8–27)
Bilirubin Total: 1.1 mg/dL (ref 0.0–1.2)
CO2: 23 mmol/L (ref 18–29)
Calcium: 8.6 mg/dL (ref 8.6–10.2)
Chloride: 96 mmol/L (ref 96–106)
Creatinine, Ser: 0.95 mg/dL (ref 0.76–1.27)
GFR calc Af Amer: 90 mL/min/{1.73_m2} (ref >59)
GFR calc non Af Amer: 78 mL/min/{1.73_m2} (ref >59)
Globulin, Total: 2.1 g/dL (ref 1.5–4.5)
Glucose: 102 mg/dL — ABNORMAL HIGH (ref 65–99)
Potassium: 4.3 mmol/L (ref 3.5–5.2)
Sodium: 135 mmol/L (ref 134–144)
Total Protein: 6 g/dL (ref 6.0–8.5)

## 2016-10-23 LAB — LIPID PANEL
Chol/HDL Ratio: 1.9 ratio units (ref 0.0–5.0)
Cholesterol, Total: 157 mg/dL (ref 100–199)
HDL: 84 mg/dL (ref >39)
LDL Calculated: 63 mg/dL (ref 0–99)
Triglycerides: 51 mg/dL (ref 0–149)
VLDL Cholesterol Cal: 10 mg/dL (ref 5–40)

## 2016-10-23 MED ORDER — CARVEDILOL 3.125 MG PO TABS: 3.1250 mg | ORAL_TABLET | Freq: Two times a day (BID) | ORAL | 3 refills | Status: DC

## 2016-10-23 MED ORDER — ATORVASTATIN CALCIUM 40 MG PO TABS: 40.0000 mg | ORAL_TABLET | Freq: Every day | ORAL | 3 refills | Status: DC

## 2016-10-23 MED ORDER — NITROGLYCERIN 0.4 MG SL SUBL: 0.4000 mg | SUBLINGUAL_TABLET | SUBLINGUAL | 6 refills | Status: DC | PRN

## 2016-10-23 NOTE — Patient Instructions (Signed)
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
## 2016-11-02 DIAGNOSIS — F4323 Adjustment disorder with mixed anxiety and depressed mood: Secondary | ICD-10-CM | POA: Diagnosis not present

## 2016-11-10 DIAGNOSIS — F4323 Adjustment disorder with mixed anxiety and depressed mood: Secondary | ICD-10-CM | POA: Diagnosis not present

## 2016-11-16 DIAGNOSIS — M5441 Lumbago with sciatica, right side: Secondary | ICD-10-CM | POA: Diagnosis not present

## 2016-11-30 DIAGNOSIS — F4323 Adjustment disorder with mixed anxiety and depressed mood: Secondary | ICD-10-CM | POA: Diagnosis not present

## 2016-12-22 DIAGNOSIS — F4323 Adjustment disorder with mixed anxiety and depressed mood: Secondary | ICD-10-CM | POA: Diagnosis not present

## 2016-12-24 ENCOUNTER — Telehealth: Payer: Self-pay | Admitting: Nurse Practitioner

## 2016-12-24 NOTE — Telephone Encounter (Signed)
-----   Message from Thayer Headings, MD sent at 12/24/2016 10:34 AM EDT ----- Regarding: FW: End of treatment TWILIGHT resrach study Lets start ASA 81 mg a day   ----- Message ----- From: Karilyn Cota, RN Sent: 12/23/2016   1:19 PM To: Thayer Headings, MD Subject: End of treatment TWILIGHT resrach study        Dr. Acie Fredrickson,    Mr. Snyders has a research appointment 01/20/17. At this appointment he will no longer receive Brilinta or ASA/Placebo.  Further antiplatelet therapy will be at your discretion (including ASA). A script will need to be sent to his pharmacy.  Thank you, tammy

## 2016-12-24 NOTE — Telephone Encounter (Signed)
Left message for patient to call back to discuss treatment after the end of the twilight study.

## 2017-01-07 NOTE — Telephone Encounter (Signed)
Left detailed message on patient's voice mail that per Dr. Acie Fredrickson, he should start Aspirin 81 mg at the end of the Twilight study. I advised him to call back with questions or concerns. I will add Aspirin to his medication list after 5/30.

## 2017-01-08 ENCOUNTER — Telehealth: Payer: Self-pay | Admitting: Cardiovascular Disease

## 2017-01-08 NOTE — Telephone Encounter (Signed)
Patient states that he is returning a call from [place]. I informed patient that Sharyn Lull was out of office but that I would send a message to triage. Thanks.

## 2017-01-08 NOTE — Telephone Encounter (Signed)
Reviewed instructions from [place], RN note from yesterday.  Instructed pt to begin ASA 81 mg on 5/31. Pt inquiring if he would be starting another medication after stopping Brilinta.  Informed pt I would forward to [facility] to address when she returns to the office next week.  He understands that she will call him only if he needs to start another med beside ASA at the end of the month.

## 2017-01-11 NOTE — Telephone Encounter (Signed)
Spoke with patient to advise that per Dr. Acie Fredrickson, he does not need to take any additional medications at the end of the twilight study. He is aware to start Aspirin 81 mg on 01/21/17. He is scheduled for 6 month f/u on Sept. 5.  He thanked me for the call.

## 2017-01-13 DIAGNOSIS — F4323 Adjustment disorder with mixed anxiety and depressed mood: Secondary | ICD-10-CM | POA: Diagnosis not present

## 2017-01-14 DIAGNOSIS — D229 Melanocytic nevi, unspecified: Secondary | ICD-10-CM | POA: Diagnosis not present

## 2017-01-20 ENCOUNTER — Other Ambulatory Visit: Payer: Self-pay | Admitting: *Deleted

## 2017-01-20 ENCOUNTER — Encounter: Payer: Self-pay | Admitting: *Deleted

## 2017-01-20 DIAGNOSIS — Z006 Encounter for examination for normal comparison and control in clinical research program: Secondary | ICD-10-CM

## 2017-01-20 MED ORDER — ASPIRIN EC 81 MG PO TBEC
81.0000 mg | DELAYED_RELEASE_TABLET | Freq: Every day | ORAL | 3 refills | Status: DC
Start: 2017-01-20 — End: 2019-02-23

## 2017-01-20 NOTE — Progress Notes (Signed)
TWILIGHT Research study month 15 follow up visit completed. Patient denies any bleeding events. After pill count patient has been 97 % compliant with both ASA/Placebo and Brilinta. He has been instructed to start ASA 81 mg daily after TWILIGHT study and will do so tomorrow. ( he took his ASA/placebo this morning.) Patient will remain in research follow up until 18 month phone call is completed. Patient verbalizes understanding. I thanked the patient for participating in study.

## 2017-02-04 DIAGNOSIS — F4323 Adjustment disorder with mixed anxiety and depressed mood: Secondary | ICD-10-CM | POA: Diagnosis not present

## 2017-02-05 DIAGNOSIS — D3617 Benign neoplasm of peripheral nerves and autonomic nervous system of trunk, unspecified: Secondary | ICD-10-CM | POA: Diagnosis not present

## 2017-02-05 DIAGNOSIS — D485 Neoplasm of uncertain behavior of skin: Secondary | ICD-10-CM | POA: Diagnosis not present

## 2017-02-05 DIAGNOSIS — L821 Other seborrheic keratosis: Secondary | ICD-10-CM | POA: Diagnosis not present

## 2017-02-05 DIAGNOSIS — C44519 Basal cell carcinoma of skin of other part of trunk: Secondary | ICD-10-CM | POA: Diagnosis not present

## 2017-02-05 DIAGNOSIS — L812 Freckles: Secondary | ICD-10-CM | POA: Diagnosis not present

## 2017-02-05 DIAGNOSIS — D692 Other nonthrombocytopenic purpura: Secondary | ICD-10-CM | POA: Diagnosis not present

## 2017-02-23 DIAGNOSIS — F4323 Adjustment disorder with mixed anxiety and depressed mood: Secondary | ICD-10-CM | POA: Diagnosis not present

## 2017-03-16 DIAGNOSIS — F4323 Adjustment disorder with mixed anxiety and depressed mood: Secondary | ICD-10-CM | POA: Diagnosis not present

## 2017-04-12 ENCOUNTER — Telehealth: Payer: Self-pay | Admitting: *Deleted

## 2017-04-15 DIAGNOSIS — F4323 Adjustment disorder with mixed anxiety and depressed mood: Secondary | ICD-10-CM | POA: Diagnosis not present

## 2017-04-15 NOTE — Telephone Encounter (Signed)
18 month phone visit complete. Pt is doing well. No complaints of bleeding, cp, or sob. He is continuing ASA 81 mg daily. He will continue to follow with Dr Cathie Olden. Thanked him for participating in the study.

## 2017-04-19 DIAGNOSIS — Z85828 Personal history of other malignant neoplasm of skin: Secondary | ICD-10-CM | POA: Diagnosis not present

## 2017-04-19 DIAGNOSIS — D1801 Hemangioma of skin and subcutaneous tissue: Secondary | ICD-10-CM | POA: Diagnosis not present

## 2017-04-19 DIAGNOSIS — L570 Actinic keratosis: Secondary | ICD-10-CM | POA: Diagnosis not present

## 2017-04-19 DIAGNOSIS — D485 Neoplasm of uncertain behavior of skin: Secondary | ICD-10-CM | POA: Diagnosis not present

## 2017-04-19 DIAGNOSIS — C44519 Basal cell carcinoma of skin of other part of trunk: Secondary | ICD-10-CM | POA: Diagnosis not present

## 2017-04-20 DIAGNOSIS — H40013 Open angle with borderline findings, low risk, bilateral: Secondary | ICD-10-CM | POA: Diagnosis not present

## 2017-04-20 DIAGNOSIS — H01001 Unspecified blepharitis right upper eyelid: Secondary | ICD-10-CM | POA: Diagnosis not present

## 2017-04-20 DIAGNOSIS — H01002 Unspecified blepharitis right lower eyelid: Secondary | ICD-10-CM | POA: Diagnosis not present

## 2017-04-20 DIAGNOSIS — H01004 Unspecified blepharitis left upper eyelid: Secondary | ICD-10-CM | POA: Diagnosis not present

## 2017-04-22 ENCOUNTER — Telehealth: Payer: Self-pay | Admitting: Cardiovascular Disease

## 2017-04-22 NOTE — Telephone Encounter (Signed)
New message    Pt has a question about lab work. Please call.

## 2017-04-22 NOTE — Telephone Encounter (Signed)
Pt knows to be fasting for lab tomorrow.

## 2017-04-22 NOTE — Telephone Encounter (Signed)
Spoke with patient about lab appointment scheduled for tomorrow, pt advised CBC with diff/lipid profile/CMET scheduled for tomorrow.

## 2017-04-23 ENCOUNTER — Other Ambulatory Visit: Payer: Medicare Other | Admitting: *Deleted

## 2017-04-23 DIAGNOSIS — Z9861 Coronary angioplasty status: Secondary | ICD-10-CM | POA: Diagnosis not present

## 2017-04-23 DIAGNOSIS — D721 Eosinophilia, unspecified: Secondary | ICD-10-CM

## 2017-04-23 DIAGNOSIS — I2510 Atherosclerotic heart disease of native coronary artery without angina pectoris: Secondary | ICD-10-CM | POA: Diagnosis not present

## 2017-04-24 LAB — COMPREHENSIVE METABOLIC PANEL
ALT: 13 IU/L (ref 0–44)
AST: 20 IU/L (ref 0–40)
Albumin/Globulin Ratio: 1.7 (ref 1.2–2.2)
Albumin: 3.8 g/dL (ref 3.5–4.8)
Alkaline Phosphatase: 59 IU/L (ref 39–117)
BUN/Creatinine Ratio: 17 (ref 10–24)
BUN: 17 mg/dL (ref 8–27)
Bilirubin Total: 0.8 mg/dL (ref 0.0–1.2)
CO2: 24 mmol/L (ref 20–29)
Calcium: 9.2 mg/dL (ref 8.6–10.2)
Chloride: 99 mmol/L (ref 96–106)
Creatinine, Ser: 1.01 mg/dL (ref 0.76–1.27)
GFR calc Af Amer: 84 mL/min/{1.73_m2} (ref >59)
GFR calc non Af Amer: 72 mL/min/{1.73_m2} (ref >59)
Globulin, Total: 2.2 g/dL (ref 1.5–4.5)
Glucose: 101 mg/dL — ABNORMAL HIGH (ref 65–99)
Potassium: 4.9 mmol/L (ref 3.5–5.2)
Sodium: 138 mmol/L (ref 134–144)
Total Protein: 6 g/dL (ref 6.0–8.5)

## 2017-04-24 LAB — CBC WITH DIFFERENTIAL/PLATELET
Basophils Absolute: 0 10*3/uL (ref 0.0–0.2)
Basos: 0 %
EOS (ABSOLUTE): 6.7 10*3/uL — ABNORMAL HIGH (ref 0.0–0.4)
Eos: 53 %
Hematocrit: 42.8 % (ref 37.5–51.0)
Hemoglobin: 14.4 g/dL (ref 13.0–17.7)
Immature Grans (Abs): 0 10*3/uL (ref 0.0–0.1)
Immature Granulocytes: 0 %
Lymphocytes Absolute: 2 10*3/uL (ref 0.7–3.1)
Lymphs: 15 %
MCH: 32.9 pg (ref 26.6–33.0)
MCHC: 33.6 g/dL (ref 31.5–35.7)
MCV: 98 fL — ABNORMAL HIGH (ref 79–97)
Monocytes Absolute: 0.8 10*3/uL (ref 0.1–0.9)
Monocytes: 6 %
Neutrophils Absolute: 3.4 10*3/uL (ref 1.4–7.0)
Neutrophils: 26 %
Platelets: 234 10*3/uL (ref 150–379)
RBC: 4.38 x10E6/uL (ref 4.14–5.80)
RDW: 13.6 % (ref 12.3–15.4)
WBC: 13 10*3/uL — ABNORMAL HIGH (ref 3.4–10.8)

## 2017-04-24 LAB — LIPID PANEL
Chol/HDL Ratio: 2.2 ratio (ref 0.0–5.0)
Cholesterol, Total: 155 mg/dL (ref 100–199)
HDL: 69 mg/dL (ref >39)
LDL Calculated: 72 mg/dL (ref 0–99)
Triglycerides: 71 mg/dL (ref 0–149)
VLDL Cholesterol Cal: 14 mg/dL (ref 5–40)

## 2017-04-28 ENCOUNTER — Encounter: Payer: Self-pay | Admitting: Cardiovascular Disease

## 2017-04-28 ENCOUNTER — Ambulatory Visit (INDEPENDENT_AMBULATORY_CARE_PROVIDER_SITE_OTHER): Payer: Medicare Other | Admitting: Cardiovascular Disease

## 2017-04-28 VITALS — BP 124/60 | HR 74 | Ht 67.5 in | Wt 170.4 lb

## 2017-04-28 DIAGNOSIS — I2510 Atherosclerotic heart disease of native coronary artery without angina pectoris: Secondary | ICD-10-CM

## 2017-04-28 DIAGNOSIS — E782 Mixed hyperlipidemia: Secondary | ICD-10-CM | POA: Diagnosis not present

## 2017-04-28 DIAGNOSIS — I10 Essential (primary) hypertension: Secondary | ICD-10-CM | POA: Diagnosis not present

## 2017-04-28 DIAGNOSIS — Z9861 Coronary angioplasty status: Secondary | ICD-10-CM
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
## 2017-04-28 NOTE — Patient Instructions (Signed)

## 2017-05-07 DIAGNOSIS — R05 Cough: Secondary | ICD-10-CM | POA: Diagnosis not present

## 2017-05-17 DIAGNOSIS — F4323 Adjustment disorder with mixed anxiety and depressed mood: Secondary | ICD-10-CM | POA: Diagnosis not present

## 2017-05-20 DIAGNOSIS — J449 Chronic obstructive pulmonary disease, unspecified: Secondary | ICD-10-CM | POA: Diagnosis not present

## 2017-05-20 DIAGNOSIS — M79605 Pain in left leg: Secondary | ICD-10-CM | POA: Diagnosis not present

## 2017-05-20 DIAGNOSIS — Z8546 Personal history of malignant neoplasm of prostate: Secondary | ICD-10-CM | POA: Diagnosis not present

## 2017-05-20 DIAGNOSIS — Z1389 Encounter for screening for other disorder: Secondary | ICD-10-CM | POA: Diagnosis not present

## 2017-05-20 DIAGNOSIS — Z23 Encounter for immunization: Secondary | ICD-10-CM | POA: Diagnosis not present

## 2017-05-20 DIAGNOSIS — Z0000 Encounter for general adult medical examination without abnormal findings: Secondary | ICD-10-CM | POA: Diagnosis not present

## 2017-05-20 DIAGNOSIS — J387 Other diseases of larynx: Secondary | ICD-10-CM | POA: Diagnosis not present

## 2017-05-20 DIAGNOSIS — M79604 Pain in right leg: Secondary | ICD-10-CM | POA: Diagnosis not present

## 2017-06-08 DIAGNOSIS — F4323 Adjustment disorder with mixed anxiety and depressed mood: Secondary | ICD-10-CM | POA: Diagnosis not present

## 2017-06-17 DIAGNOSIS — M5442 Lumbago with sciatica, left side: Secondary | ICD-10-CM | POA: Diagnosis not present

## 2017-06-17 DIAGNOSIS — M545 Low back pain: Secondary | ICD-10-CM | POA: Diagnosis not present

## 2017-06-21 DIAGNOSIS — M5442 Lumbago with sciatica, left side: Secondary | ICD-10-CM | POA: Diagnosis not present

## 2017-06-21 DIAGNOSIS — M545 Low back pain: Secondary | ICD-10-CM | POA: Diagnosis not present

## 2017-06-24 DIAGNOSIS — M545 Low back pain: Secondary | ICD-10-CM | POA: Diagnosis not present

## 2017-06-24 DIAGNOSIS — M5442 Lumbago with sciatica, left side: Secondary | ICD-10-CM | POA: Diagnosis not present

## 2017-06-29 DIAGNOSIS — M545 Low back pain: Secondary | ICD-10-CM | POA: Diagnosis not present

## 2017-06-29 DIAGNOSIS — M5442 Lumbago with sciatica, left side: Secondary | ICD-10-CM | POA: Diagnosis not present

## 2017-07-02 DIAGNOSIS — M5442 Lumbago with sciatica, left side: Secondary | ICD-10-CM | POA: Diagnosis not present

## 2017-07-02 DIAGNOSIS — M545 Low back pain: Secondary | ICD-10-CM | POA: Diagnosis not present

## 2017-07-05 DIAGNOSIS — M5442 Lumbago with sciatica, left side: Secondary | ICD-10-CM | POA: Diagnosis not present

## 2017-07-05 DIAGNOSIS — M545 Low back pain: Secondary | ICD-10-CM | POA: Diagnosis not present

## 2017-07-06 DIAGNOSIS — F4323 Adjustment disorder with mixed anxiety and depressed mood: Secondary | ICD-10-CM | POA: Diagnosis not present

## 2017-07-08 DIAGNOSIS — M545 Low back pain: Secondary | ICD-10-CM | POA: Diagnosis not present

## 2017-07-08 DIAGNOSIS — M5442 Lumbago with sciatica, left side: Secondary | ICD-10-CM | POA: Diagnosis not present

## 2017-07-14 DIAGNOSIS — I2510 Atherosclerotic heart disease of native coronary artery without angina pectoris: Secondary | ICD-10-CM | POA: Diagnosis not present

## 2017-07-14 DIAGNOSIS — H40059 Ocular hypertension, unspecified eye: Secondary | ICD-10-CM | POA: Diagnosis not present

## 2017-07-14 DIAGNOSIS — I252 Old myocardial infarction: Secondary | ICD-10-CM | POA: Diagnosis not present

## 2017-07-14 DIAGNOSIS — Z8546 Personal history of malignant neoplasm of prostate: Secondary | ICD-10-CM | POA: Diagnosis not present

## 2017-07-14 DIAGNOSIS — J449 Chronic obstructive pulmonary disease, unspecified: Secondary | ICD-10-CM | POA: Diagnosis not present

## 2017-07-16 ENCOUNTER — Other Ambulatory Visit: Payer: Self-pay

## 2017-07-16 DIAGNOSIS — J181 Lobar pneumonia, unspecified organism: Secondary | ICD-10-CM | POA: Insufficient documentation

## 2017-07-16 DIAGNOSIS — R000 Tachycardia, unspecified: Secondary | ICD-10-CM | POA: Diagnosis not present

## 2017-07-16 DIAGNOSIS — I10 Essential (primary) hypertension: Secondary | ICD-10-CM | POA: Insufficient documentation

## 2017-07-16 DIAGNOSIS — I252 Old myocardial infarction: Secondary | ICD-10-CM | POA: Diagnosis not present

## 2017-07-16 DIAGNOSIS — Z87891 Personal history of nicotine dependence: Secondary | ICD-10-CM | POA: Insufficient documentation

## 2017-07-16 DIAGNOSIS — I2510 Atherosclerotic heart disease of native coronary artery without angina pectoris: Secondary | ICD-10-CM | POA: Diagnosis not present

## 2017-07-16 DIAGNOSIS — J449 Chronic obstructive pulmonary disease, unspecified: Secondary | ICD-10-CM | POA: Diagnosis not present

## 2017-07-16 DIAGNOSIS — Z79899 Other long term (current) drug therapy: Secondary | ICD-10-CM | POA: Insufficient documentation

## 2017-07-16 DIAGNOSIS — R05 Cough: Secondary | ICD-10-CM | POA: Diagnosis not present

## 2017-07-16 DIAGNOSIS — J189 Pneumonia, unspecified organism: Secondary | ICD-10-CM | POA: Diagnosis not present

## 2017-07-16 DIAGNOSIS — R55 Syncope and collapse: Secondary | ICD-10-CM | POA: Insufficient documentation

## 2017-07-16 DIAGNOSIS — Z8546 Personal history of malignant neoplasm of prostate: Secondary | ICD-10-CM | POA: Diagnosis not present

## 2017-07-16 DIAGNOSIS — Z7982 Long term (current) use of aspirin: Secondary | ICD-10-CM | POA: Insufficient documentation

## 2017-07-17 ENCOUNTER — Encounter (HOSPITAL_COMMUNITY): Payer: Self-pay | Admitting: Emergency Medicine

## 2017-07-17 ENCOUNTER — Emergency Department (HOSPITAL_COMMUNITY): Payer: Medicare Other

## 2017-07-17 ENCOUNTER — Emergency Department (HOSPITAL_COMMUNITY)
Admission: EM | Admit: 2017-07-17 | Discharge: 2017-07-17 | Disposition: A | Discharge status: Home / Self Care | Payer: Medicare Other | Attending: Emergency Medicine | Admitting: Emergency Medicine

## 2017-07-17 DIAGNOSIS — J181 Lobar pneumonia, unspecified organism: Secondary | ICD-10-CM

## 2017-07-17 DIAGNOSIS — J189 Pneumonia, unspecified organism: Secondary | ICD-10-CM

## 2017-07-17 DIAGNOSIS — R05 Cough: Secondary | ICD-10-CM | POA: Diagnosis not present

## 2017-07-17 DIAGNOSIS — R55 Syncope and collapse: Secondary | ICD-10-CM | POA: Diagnosis not present

## 2017-07-17 LAB — COMPREHENSIVE METABOLIC PANEL
ALT: 17 U/L (ref 17–63)
AST: 19 U/L (ref 15–41)
Albumin: 3.9 g/dL (ref 3.5–5.0)
Alkaline Phosphatase: 51 U/L (ref 38–126)
Anion gap: 9 (ref 5–15)
BUN: 17 mg/dL (ref 6–20)
CO2: 25 mmol/L (ref 22–32)
Calcium: 8.8 mg/dL — ABNORMAL LOW (ref 8.9–10.3)
Chloride: 100 mmol/L — ABNORMAL LOW (ref 101–111)
Creatinine, Ser: 0.99 mg/dL (ref 0.61–1.24)
GFR calc Af Amer: >60 mL/min (ref >60)
GFR calc non Af Amer: >60 mL/min (ref >60)
Glucose, Bld: 100 mg/dL — ABNORMAL HIGH (ref 65–99)
Potassium: 3.8 mmol/L (ref 3.5–5.1)
Sodium: 134 mmol/L — ABNORMAL LOW (ref 135–145)
Total Bilirubin: 1.7 mg/dL — ABNORMAL HIGH (ref 0.3–1.2)
Total Protein: 6.6 g/dL (ref 6.5–8.1)

## 2017-07-17 LAB — I-STAT TROPONIN, ED: Troponin i, poc: 0 ng/mL (ref 0.00–0.08)

## 2017-07-17 LAB — CBC
HCT: 41.7 % (ref 39.0–52.0)
Hemoglobin: 14.4 g/dL (ref 13.0–17.0)
MCH: 33.7 pg (ref 26.0–34.0)
MCHC: 34.5 g/dL (ref 30.0–36.0)
MCV: 97.7 fL (ref 78.0–100.0)
Platelets: 202 10*3/uL (ref 150–400)
RBC: 4.27 MIL/uL (ref 4.22–5.81)
RDW: 13.7 % (ref 11.5–15.5)
WBC: 16.4 10*3/uL — ABNORMAL HIGH (ref 4.0–10.5)

## 2017-07-17 LAB — URINALYSIS, ROUTINE W REFLEX MICROSCOPIC
Bilirubin Urine: NEGATIVE
Glucose, UA: NEGATIVE mg/dL
Hgb urine dipstick: NEGATIVE
Ketones, ur: 20 mg/dL — AB
Leukocytes, UA: NEGATIVE
Nitrite: NEGATIVE
Protein, ur: NEGATIVE mg/dL
Specific Gravity, Urine: 1.023 (ref 1.005–1.030)
pH: 5 (ref 5.0–8.0)

## 2017-07-17 LAB — LIPASE, BLOOD: Lipase: 17 U/L (ref 11–51)

## 2017-07-17 LAB — I-STAT CG4 LACTIC ACID, ED: Lactic Acid, Venous: 0.98 mmol/L (ref 0.5–1.9)

## 2017-07-17 MED ORDER — AZITHROMYCIN 250 MG PO TABS: 250.0000 mg | ORAL_TABLET | Freq: Every day | ORAL | 0 refills | Status: DC

## 2017-07-17 MED ORDER — SODIUM CHLORIDE 0.9 % IV BOLUS (SEPSIS)
1000.0000 mL | Freq: Once | INTRAVENOUS | Status: AC
Start: 2017-07-17 — End: 2017-07-17
  Administered 2017-07-17: 1000 mL via INTRAVENOUS

## 2017-07-17 MED ORDER — DEXTROSE 5 % IV SOLN
1.0000 g | Freq: Once | INTRAVENOUS | Status: AC
  Administered 2017-07-17: 1 g via INTRAVENOUS
  Filled 2017-07-17: qty 10

## 2017-07-17 MED ORDER — ACETAMINOPHEN 325 MG PO TABS
650.0000 mg | ORAL_TABLET | Freq: Once | ORAL | Status: AC
  Administered 2017-07-17: 650 mg via ORAL
  Filled 2017-07-17: qty 2

## 2017-07-17 MED ORDER — AZITHROMYCIN 250 MG PO TABS
500.0000 mg | ORAL_TABLET | Freq: Once | ORAL | Status: AC
Start: 2017-07-17 — End: 2017-07-17
  Administered 2017-07-17: 500 mg via ORAL
  Filled 2017-07-17: qty 2

## 2017-07-17 NOTE — ED Notes (Signed)
Patient transported to X-ray 

## 2017-07-17 NOTE — ED Notes (Signed)
Pt verbalized understanding of d/c instructions and has no further questions. Pt is stable, A&Ox4, VSS.  

## 2017-07-17 NOTE — Discharge Instructions (Signed)
Zithromax as prescribed.  Tylenol 1000 mg rotated with ibuprofen 600 mg every 4 hours as needed for fever.  Return to the emergency department if you develop chest pain, difficulty breathing, severe weakness, or other new and concerning symptoms.
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
## 2017-07-17 NOTE — ED Triage Notes (Signed)
Pt presents with family after near syncopal episode while attending hockey game this evening. Pt has had worsening coughing episodes and well as significant diarrhea this week. Pt has been using Mucinex DM and flonase with some relief.  Son states pt became diaphoretic with rigors tonight when attempting to leave game. Pt denies pain at time and currently has no pain.  Pt is A & O in triage. Skin is very warm to touch.

## 2017-07-21 DIAGNOSIS — K219 Gastro-esophageal reflux disease without esophagitis: Secondary | ICD-10-CM | POA: Diagnosis not present

## 2017-07-21 DIAGNOSIS — J181 Lobar pneumonia, unspecified organism: Secondary | ICD-10-CM | POA: Diagnosis not present

## 2017-07-21 DIAGNOSIS — J449 Chronic obstructive pulmonary disease, unspecified: Secondary | ICD-10-CM | POA: Diagnosis not present

## 2017-07-21 DIAGNOSIS — R05 Cough: Secondary | ICD-10-CM | POA: Diagnosis not present

## 2017-07-31 ENCOUNTER — Other Ambulatory Visit: Payer: Self-pay | Admitting: Cardiovascular Disease

## 2017-08-05 ENCOUNTER — Telehealth: Payer: Self-pay | Admitting: Emergency Medicine

## 2017-08-05 ENCOUNTER — Ambulatory Visit (INDEPENDENT_AMBULATORY_CARE_PROVIDER_SITE_OTHER): Payer: Medicare Other | Admitting: Emergency Medicine

## 2017-08-05 ENCOUNTER — Ambulatory Visit (INDEPENDENT_AMBULATORY_CARE_PROVIDER_SITE_OTHER)
Admission: RE | Admit: 2017-08-05 | Discharge: 2017-08-05 | Disposition: A | Discharge status: Home / Self Care | Payer: Medicare Other | Source: Ambulatory Visit | Attending: Emergency Medicine | Admitting: Emergency Medicine

## 2017-08-05 ENCOUNTER — Encounter: Payer: Self-pay | Admitting: Emergency Medicine

## 2017-08-05 DIAGNOSIS — I2510 Atherosclerotic heart disease of native coronary artery without angina pectoris: Secondary | ICD-10-CM | POA: Diagnosis not present

## 2017-08-05 DIAGNOSIS — Z9861 Coronary angioplasty status: Secondary | ICD-10-CM

## 2017-08-05 DIAGNOSIS — R053 Chronic cough: Secondary | ICD-10-CM

## 2017-08-05 DIAGNOSIS — J438 Other emphysema: Secondary | ICD-10-CM

## 2017-08-05 DIAGNOSIS — R05 Cough: Secondary | ICD-10-CM

## 2017-08-05 NOTE — Assessment & Plan Note (Signed)
Please continue to take Nexium 40 mg twice a day.  Remember to take this medication 30-60 minutes before or after eating Please continue fluticasone nasal spray, 2 sprays each nostril once daily.  Take this medication every day We will repeat your chest x-ray today. We will perform full pulmonary function testing at your next office visit Depending on your pulmonary function testing, the improvement in your cough, we may decide to try inhaled medication  If your cough persists then we may decide to pursue a test called a bronchoscopy to evaluate and inspect your airways Try to avoid throat clearing.  You would likely benefit from a period of voice rest.  Do not use mentholated cough drops.  Try using a sugar-free candy to soothe your throat instead. Sleep with your head elevated if possible Avoid spicy foods, eating after 8 PM. Follow with Dr Lamonte Sakai next available with full PFT on the same day

## 2017-08-05 NOTE — Patient Instructions (Signed)
Please continue to take Nexium 40 mg twice a day.  Remember to take this medication 30-60 minutes before or after eating Please continue fluticasone nasal spray, 2 sprays each nostril once daily.  Take this medication every day We will repeat your chest x-ray today. We will perform full pulmonary function testing at your next office visit Depending on your pulmonary function testing, the improvement in your cough, we may decide to try inhaled medication  If your cough persists then we may decide to pursue a test called a bronchoscopy to evaluate and inspect your airways Try to avoid throat clearing.  You would likely benefit from a period of voice rest.  Do not use mentholated cough drops.  Try using a sugar-free candy to soothe your throat instead. Sleep with your head elevated if possible Avoid spicy foods, eating after 8 PM. Follow with Dr Lamonte Sakai next available with full PFT on the same day

## 2017-08-05 NOTE — Telephone Encounter (Signed)
Called pt regarding him wanting his cxr to go to Dr. Laurann Montana and just verified with pt which office this was to be sent to.  Pt gave me the office name and I located the fax number of practice.   cxr results sent to Dr. Laurann Montana. Nothing further needed.
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
## 2017-08-05 NOTE — Assessment & Plan Note (Signed)
Confirmed on his spirometry from 2 years ago.  Moderately severe obstruction.  He is not on bronchodilators.  Unclear to me whether his cough is related to lower airways disease but this is possible.  May decide to try empiric bronchodilators as we go forward to see if we can impact his symptoms

## 2017-08-10 ENCOUNTER — Ambulatory Visit (INDEPENDENT_AMBULATORY_CARE_PROVIDER_SITE_OTHER): Payer: Medicare Other | Admitting: Emergency Medicine

## 2017-08-10 DIAGNOSIS — D1801 Hemangioma of skin and subcutaneous tissue: Secondary | ICD-10-CM | POA: Diagnosis not present

## 2017-08-10 DIAGNOSIS — J438 Other emphysema: Secondary | ICD-10-CM | POA: Diagnosis not present

## 2017-08-10 DIAGNOSIS — L570 Actinic keratosis: Secondary | ICD-10-CM | POA: Diagnosis not present

## 2017-08-10 DIAGNOSIS — Z85828 Personal history of other malignant neoplasm of skin: Secondary | ICD-10-CM | POA: Diagnosis not present

## 2017-08-10 DIAGNOSIS — R053 Chronic cough: Secondary | ICD-10-CM

## 2017-08-10 DIAGNOSIS — D2271 Melanocytic nevi of right lower limb, including hip: Secondary | ICD-10-CM | POA: Diagnosis not present

## 2017-08-10 DIAGNOSIS — L812 Freckles: Secondary | ICD-10-CM | POA: Diagnosis not present

## 2017-08-10 DIAGNOSIS — L821 Other seborrheic keratosis: Secondary | ICD-10-CM | POA: Diagnosis not present

## 2017-08-10 DIAGNOSIS — R05 Cough: Secondary | ICD-10-CM

## 2017-08-10 LAB — PULMONARY FUNCTION TEST
DL/VA % pred: 79 %
DL/VA: 3.51 ml/min/mmHg/L
DLCO cor % pred: 65 %
DLCO cor: 19.12 ml/min/mmHg
DLCO unc % pred: 63 %
DLCO unc: 18.44 ml/min/mmHg
FEF 25-75 Post: 0.88 L/sec
FEF 25-75 Pre: 0.49 L/sec
FEF2575-%Change-Post: 79 %
FEF2575-%Pred-Post: 45 %
FEF2575-%Pred-Pre: 25 %
FEV1-%Change-Post: 33 %
FEV1-%Pred-Post: 54 %
FEV1-%Pred-Pre: 40 %
FEV1-Post: 1.47 L
FEV1-Pre: 1.1 L
FEV1FVC-%Change-Post: 13 %
FEV1FVC-%Pred-Pre: 63 %
FEV6-%Change-Post: 18 %
FEV6-%Pred-Post: 76 %
FEV6-%Pred-Pre: 64 %
FEV6-Post: 2.71 L
FEV6-Pre: 2.29 L
FEV6FVC-%Change-Post: 0 %
FEV6FVC-%Pred-Post: 103 %
FEV6FVC-%Pred-Pre: 102 %
FVC-%Change-Post: 17 %
FVC-%Pred-Post: 74 %
FVC-%Pred-Pre: 63 %
FVC-Post: 2.82 L
FVC-Pre: 2.4 L
Post FEV1/FVC ratio: 52 %
Post FEV6/FVC ratio: 96 %
Pre FEV1/FVC ratio: 46 %
Pre FEV6/FVC Ratio: 96 %
RV % pred: 214 %
RV: 5.23 L
TLC % pred: 121 %
TLC: 7.95 L

## 2017-08-10 NOTE — Progress Notes (Signed)
PFT completed today 08/10/17  

## 2017-08-12 DIAGNOSIS — J449 Chronic obstructive pulmonary disease, unspecified: Secondary | ICD-10-CM | POA: Diagnosis not present

## 2017-08-12 DIAGNOSIS — I252 Old myocardial infarction: Secondary | ICD-10-CM | POA: Diagnosis not present

## 2017-08-12 DIAGNOSIS — I2510 Atherosclerotic heart disease of native coronary artery without angina pectoris: Secondary | ICD-10-CM | POA: Diagnosis not present

## 2017-08-12 DIAGNOSIS — H40059 Ocular hypertension, unspecified eye: Secondary | ICD-10-CM | POA: Diagnosis not present

## 2017-08-12 DIAGNOSIS — Z8546 Personal history of malignant neoplasm of prostate: Secondary | ICD-10-CM | POA: Diagnosis not present

## 2017-08-14 DIAGNOSIS — J028 Acute pharyngitis due to other specified organisms: Secondary | ICD-10-CM | POA: Diagnosis not present

## 2017-08-14 DIAGNOSIS — R05 Cough: Secondary | ICD-10-CM | POA: Diagnosis not present

## 2017-08-31 ENCOUNTER — Institutional Professional Consult (permissible substitution): Payer: Medicare Other | Admitting: Internal Medicine

## 2017-09-03 ENCOUNTER — Encounter: Payer: Self-pay | Admitting: Emergency Medicine

## 2017-09-03 ENCOUNTER — Ambulatory Visit (INDEPENDENT_AMBULATORY_CARE_PROVIDER_SITE_OTHER): Payer: Medicare Other | Admitting: Emergency Medicine

## 2017-09-03 DIAGNOSIS — J438 Other emphysema: Secondary | ICD-10-CM | POA: Diagnosis not present

## 2017-09-03 DIAGNOSIS — J301 Allergic rhinitis due to pollen: Secondary | ICD-10-CM | POA: Diagnosis not present

## 2017-09-03 DIAGNOSIS — K219 Gastro-esophageal reflux disease without esophagitis: Secondary | ICD-10-CM | POA: Diagnosis not present

## 2017-09-03 DIAGNOSIS — J309 Allergic rhinitis, unspecified: Secondary | ICD-10-CM | POA: Insufficient documentation

## 2017-09-03 DIAGNOSIS — R05 Cough: Secondary | ICD-10-CM

## 2017-09-03 DIAGNOSIS — R053 Chronic cough: Secondary | ICD-10-CM

## 2017-09-03 MED ORDER — ALBUTEROL SULFATE HFA 108 (90 BASE) MCG/ACT IN AERS: 2.0000 | INHALATION_SPRAY | RESPIRATORY_TRACT | 5 refills | Status: DC | PRN

## 2017-09-03 NOTE — Progress Notes (Signed)
Patient seen in the office today and instructed on use of Albuterol HFA.  Patient expressed understanding and demonstrated technique.  

## 2017-09-03 NOTE — Addendum Note (Signed)
Addended by: Desmond Dike C on: 09/03/2017 09:53 AM   Modules accepted: Orders

## 2017-09-03 NOTE — Assessment & Plan Note (Addendum)
Cough is better.  Influenced by both his GERD and his allergic rhinitis.  He still has postnasal drip and is going to see Dr. Fredderick Phenix with allergy.  Continue current medications

## 2017-09-03 NOTE — Assessment & Plan Note (Signed)
Significant obstructive lung disease confirmed on his pulmonary function testing from December 2018.  He does have a positive bronchodilator response.  He is minimally symptomatic and not sure that he would truly benefit from schedule bronchodilator.  I like to start a rescue albuterol that he can use judiciously to see if he benefits.  If so then we will reconsider scheduled medication.

## 2017-09-03 NOTE — Assessment & Plan Note (Signed)
Continue Nexium once a day
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
## 2017-09-03 NOTE — Patient Instructions (Signed)
Please continue Flonase and Nexium as you have been taking them Agree with being evaluated for your allergies by Dr vanWinkle Please keep available albuterol so that you can use 2 puffs if needed for shortness of breath, wheezing, chest tightness.  You may also want to try to strategically use this medication either before exercise or if you become short winded to see if you benefit from it.  If so we may decide to start a longer acting inhaled medication at some point in the future. Follow with Dr Lamonte Sakai in 6 months or sooner if you have any problems

## 2017-09-03 NOTE — Assessment & Plan Note (Signed)
Planning for a full allergy evaluation.  Continue fluticasone nasal spray as he is taking it

## 2017-09-09 DIAGNOSIS — R05 Cough: Secondary | ICD-10-CM | POA: Diagnosis not present

## 2017-09-09 DIAGNOSIS — D721 Eosinophilia: Secondary | ICD-10-CM | POA: Diagnosis not present

## 2017-09-09 DIAGNOSIS — J300 Vasomotor rhinitis: Secondary | ICD-10-CM | POA: Diagnosis not present

## 2017-11-04 DIAGNOSIS — H40059 Ocular hypertension, unspecified eye: Secondary | ICD-10-CM | POA: Diagnosis not present

## 2017-11-04 DIAGNOSIS — Z8546 Personal history of malignant neoplasm of prostate: Secondary | ICD-10-CM | POA: Diagnosis not present

## 2017-11-04 DIAGNOSIS — I252 Old myocardial infarction: Secondary | ICD-10-CM | POA: Diagnosis not present

## 2017-11-04 DIAGNOSIS — I2510 Atherosclerotic heart disease of native coronary artery without angina pectoris: Secondary | ICD-10-CM | POA: Diagnosis not present

## 2017-11-04 DIAGNOSIS — J449 Chronic obstructive pulmonary disease, unspecified: Secondary | ICD-10-CM | POA: Diagnosis not present

## 2017-11-29 DIAGNOSIS — H40013 Open angle with borderline findings, low risk, bilateral: Secondary | ICD-10-CM | POA: Diagnosis not present

## 2017-11-29 DIAGNOSIS — H35372 Puckering of macula, left eye: Secondary | ICD-10-CM | POA: Diagnosis not present

## 2017-11-29 DIAGNOSIS — H26492 Other secondary cataract, left eye: Secondary | ICD-10-CM | POA: Diagnosis not present

## 2017-11-29 DIAGNOSIS — H349 Unspecified retinal vascular occlusion: Secondary | ICD-10-CM | POA: Diagnosis not present

## 2017-12-19 DIAGNOSIS — I252 Old myocardial infarction: Secondary | ICD-10-CM | POA: Diagnosis not present

## 2017-12-19 DIAGNOSIS — I10 Essential (primary) hypertension: Secondary | ICD-10-CM | POA: Diagnosis not present

## 2017-12-19 DIAGNOSIS — J449 Chronic obstructive pulmonary disease, unspecified: Secondary | ICD-10-CM | POA: Diagnosis not present

## 2017-12-19 DIAGNOSIS — H40059 Ocular hypertension, unspecified eye: Secondary | ICD-10-CM | POA: Diagnosis not present

## 2017-12-19 DIAGNOSIS — I2510 Atherosclerotic heart disease of native coronary artery without angina pectoris: Secondary | ICD-10-CM | POA: Diagnosis not present

## 2017-12-19 DIAGNOSIS — Z8546 Personal history of malignant neoplasm of prostate: Secondary | ICD-10-CM | POA: Diagnosis not present

## 2017-12-27 ENCOUNTER — Encounter: Payer: Self-pay | Admitting: Cardiovascular Disease

## 2017-12-27 ENCOUNTER — Telehealth: Payer: Self-pay | Admitting: Nurse Practitioner

## 2017-12-27 MED ORDER — ATORVASTATIN CALCIUM 40 MG PO TABS: 40.0000 mg | ORAL_TABLET | Freq: Every day | ORAL | 3 refills | Status: DC

## 2017-12-27 NOTE — Telephone Encounter (Signed)
Atorvastatin refill sent per patient request

## 2018-01-11 ENCOUNTER — Other Ambulatory Visit: Payer: Medicare Other

## 2018-01-11 DIAGNOSIS — I10 Essential (primary) hypertension: Secondary | ICD-10-CM | POA: Diagnosis not present

## 2018-01-11 DIAGNOSIS — Z9861 Coronary angioplasty status: Secondary | ICD-10-CM | POA: Diagnosis not present

## 2018-01-11 DIAGNOSIS — I2510 Atherosclerotic heart disease of native coronary artery without angina pectoris: Secondary | ICD-10-CM | POA: Diagnosis not present

## 2018-01-11 DIAGNOSIS — E782 Mixed hyperlipidemia: Secondary | ICD-10-CM | POA: Diagnosis not present

## 2018-01-11 LAB — BASIC METABOLIC PANEL
BUN/Creatinine Ratio: 20 (ref 10–24)
BUN: 18 mg/dL (ref 8–27)
CO2: 23 mmol/L (ref 20–29)
Calcium: 8.9 mg/dL (ref 8.6–10.2)
Chloride: 97 mmol/L (ref 96–106)
Creatinine, Ser: 0.88 mg/dL (ref 0.76–1.27)
GFR calc Af Amer: 96 mL/min/{1.73_m2} (ref >59)
GFR calc non Af Amer: 83 mL/min/{1.73_m2} (ref >59)
Glucose: 86 mg/dL (ref 65–99)
Potassium: 4.4 mmol/L (ref 3.5–5.2)
Sodium: 135 mmol/L (ref 134–144)

## 2018-01-11 LAB — HEPATIC FUNCTION PANEL
ALT: 9 IU/L (ref 0–44)
AST: 20 IU/L (ref 0–40)
Albumin: 3.9 g/dL (ref 3.5–4.8)
Alkaline Phosphatase: 63 IU/L (ref 39–117)
Bilirubin Total: 0.8 mg/dL (ref 0.0–1.2)
Bilirubin, Direct: 0.23 mg/dL (ref 0.00–0.40)
Total Protein: 6.1 g/dL (ref 6.0–8.5)

## 2018-01-11 LAB — LIPID PANEL
Chol/HDL Ratio: 2.1 ratio (ref 0.0–5.0)
Cholesterol, Total: 152 mg/dL (ref 100–199)
HDL: 71 mg/dL (ref >39)
LDL Calculated: 72 mg/dL (ref 0–99)
Triglycerides: 45 mg/dL (ref 0–149)
VLDL Cholesterol Cal: 9 mg/dL (ref 5–40)

## 2018-01-12 ENCOUNTER — Ambulatory Visit (INDEPENDENT_AMBULATORY_CARE_PROVIDER_SITE_OTHER): Payer: Medicare Other | Admitting: Cardiovascular Disease

## 2018-01-12 ENCOUNTER — Encounter: Payer: Self-pay | Admitting: Cardiovascular Disease

## 2018-01-12 VITALS — BP 110/60 | HR 66 | Ht 67.6 in | Wt 170.0 lb

## 2018-01-12 DIAGNOSIS — Z9861 Coronary angioplasty status: Secondary | ICD-10-CM

## 2018-01-12 DIAGNOSIS — E782 Mixed hyperlipidemia: Secondary | ICD-10-CM

## 2018-01-12 DIAGNOSIS — I2510 Atherosclerotic heart disease of native coronary artery without angina pectoris: Secondary | ICD-10-CM

## 2018-01-12 MED ORDER — CARVEDILOL 3.125 MG PO TABS: 3.1250 mg | ORAL_TABLET | Freq: Two times a day (BID) | ORAL | 3 refills | Status: DC

## 2018-01-12 MED ORDER — NITROGLYCERIN 0.4 MG SL SUBL: 0.4000 mg | SUBLINGUAL_TABLET | SUBLINGUAL | 6 refills | Status: DC | PRN
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
## 2018-01-12 NOTE — Patient Instructions (Addendum)
Medication Instructions:  Your physician recommends that you continue on your current medications as directed. Please refer to the Current Medication list given to you today.   Labwork: Your physician recommends that you return for lab work in: 10 months on the day of or a few days before your office visit with Dr. Acie Fredrickson.  You will need to FAST for this appointment - nothing to eat or drink after midnight the night before except water.   Testing/Procedures: None Ordered   Follow-Up: Your physician wants you to follow-up in: 10 months with Dr. Acie Fredrickson. You will receive a reminder letter in the mail two months in advance. If you don't receive a letter, please call our office to schedule the follow-up appointment.   If you need a refill on your cardiac medications before your next appointment, please call your pharmacy.   Thank you for choosing [facility]! Christen Bame, RN [phone]

## 2018-02-10 DIAGNOSIS — S0012XD Contusion of left eyelid and periocular area, subsequent encounter: Secondary | ICD-10-CM | POA: Diagnosis not present

## 2018-02-10 DIAGNOSIS — H1131 Conjunctival hemorrhage, right eye: Secondary | ICD-10-CM | POA: Diagnosis not present

## 2018-02-15 DIAGNOSIS — D3615 Benign neoplasm of peripheral nerves and autonomic nervous system of abdomen: Secondary | ICD-10-CM | POA: Diagnosis not present

## 2018-02-15 DIAGNOSIS — L578 Other skin changes due to chronic exposure to nonionizing radiation: Secondary | ICD-10-CM | POA: Diagnosis not present

## 2018-02-15 DIAGNOSIS — C4441 Basal cell carcinoma of skin of scalp and neck: Secondary | ICD-10-CM | POA: Diagnosis not present

## 2018-02-15 DIAGNOSIS — D485 Neoplasm of uncertain behavior of skin: Secondary | ICD-10-CM | POA: Diagnosis not present

## 2018-02-15 DIAGNOSIS — L812 Freckles: Secondary | ICD-10-CM | POA: Diagnosis not present

## 2018-02-15 DIAGNOSIS — L821 Other seborrheic keratosis: Secondary | ICD-10-CM | POA: Diagnosis not present

## 2018-02-15 DIAGNOSIS — Z85828 Personal history of other malignant neoplasm of skin: Secondary | ICD-10-CM | POA: Diagnosis not present

## 2018-02-15 DIAGNOSIS — D3617 Benign neoplasm of peripheral nerves and autonomic nervous system of trunk, unspecified: Secondary | ICD-10-CM | POA: Diagnosis not present

## 2018-02-15 DIAGNOSIS — L723 Sebaceous cyst: Secondary | ICD-10-CM | POA: Diagnosis not present

## 2018-04-28 ENCOUNTER — Encounter: Payer: Self-pay | Admitting: Emergency Medicine

## 2018-04-28 ENCOUNTER — Ambulatory Visit (INDEPENDENT_AMBULATORY_CARE_PROVIDER_SITE_OTHER): Payer: Medicare Other | Admitting: Emergency Medicine

## 2018-04-28 DIAGNOSIS — I2510 Atherosclerotic heart disease of native coronary artery without angina pectoris: Secondary | ICD-10-CM

## 2018-04-28 DIAGNOSIS — K219 Gastro-esophageal reflux disease without esophagitis: Secondary | ICD-10-CM | POA: Diagnosis not present

## 2018-04-28 DIAGNOSIS — J438 Other emphysema: Secondary | ICD-10-CM

## 2018-04-28 DIAGNOSIS — Z9861 Coronary angioplasty status: Secondary | ICD-10-CM

## 2018-04-28 DIAGNOSIS — J301 Allergic rhinitis due to pollen: Secondary | ICD-10-CM

## 2018-04-28 NOTE — Assessment & Plan Note (Signed)
Continue flonase 

## 2018-04-28 NOTE — Assessment & Plan Note (Signed)
Continue nexium 
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
## 2018-04-28 NOTE — Assessment & Plan Note (Signed)
Significant obstruction on his pulmonary function testing but he has minimal symptoms.  No real indication to start scheduled bronchodilators.  He has albuterol that he can use as needed but he never requires it.  Of asked him to try to use it preemptively to see if he gets any benefit.  We will revisit bronchodilator therapy when he follows up.

## 2018-04-28 NOTE — Patient Instructions (Signed)
Please keep your albuterol available to use 2 puffs if needed for shortness of breath.  You could try using this prior to exertion to see if it helps your exercise tolerance. Continue your Flonase as you are taking it Continue your Nexium as you are taking it. Get the flu shot in Dr. Delene Ruffini office as planned. Follow with Dr Lamonte Sakai in 12 months or sooner if you have any problems

## 2018-06-01 DIAGNOSIS — R21 Rash and other nonspecific skin eruption: Secondary | ICD-10-CM | POA: Diagnosis not present

## 2018-06-01 DIAGNOSIS — Z1389 Encounter for screening for other disorder: Secondary | ICD-10-CM | POA: Diagnosis not present

## 2018-06-01 DIAGNOSIS — H9193 Unspecified hearing loss, bilateral: Secondary | ICD-10-CM | POA: Diagnosis not present

## 2018-06-01 DIAGNOSIS — Z23 Encounter for immunization: Secondary | ICD-10-CM | POA: Diagnosis not present

## 2018-06-01 DIAGNOSIS — R946 Abnormal results of thyroid function studies: Secondary | ICD-10-CM | POA: Diagnosis not present

## 2018-06-01 DIAGNOSIS — I2510 Atherosclerotic heart disease of native coronary artery without angina pectoris: Secondary | ICD-10-CM | POA: Diagnosis not present

## 2018-06-01 DIAGNOSIS — K219 Gastro-esophageal reflux disease without esophagitis: Secondary | ICD-10-CM | POA: Diagnosis not present

## 2018-06-01 DIAGNOSIS — E7800 Pure hypercholesterolemia, unspecified: Secondary | ICD-10-CM | POA: Diagnosis not present

## 2018-06-01 DIAGNOSIS — J449 Chronic obstructive pulmonary disease, unspecified: Secondary | ICD-10-CM | POA: Diagnosis not present

## 2018-06-01 DIAGNOSIS — D721 Eosinophilia: Secondary | ICD-10-CM | POA: Diagnosis not present

## 2018-06-01 DIAGNOSIS — Z0000 Encounter for general adult medical examination without abnormal findings: Secondary | ICD-10-CM | POA: Diagnosis not present

## 2018-06-01 DIAGNOSIS — Z8546 Personal history of malignant neoplasm of prostate: Secondary | ICD-10-CM | POA: Diagnosis not present

## 2018-06-03 ENCOUNTER — Telehealth: Payer: Self-pay | Admitting: Hematology and Oncology

## 2018-06-03 NOTE — Telephone Encounter (Signed)
Scheduled appt per 10/11 sch message- pt is aware of appt - labs not scheduled - per patient says he has labs drawn already .

## 2018-06-07 ENCOUNTER — Inpatient Hospital Stay: Payer: Medicare Other

## 2018-06-07 ENCOUNTER — Telehealth: Payer: Self-pay | Admitting: Hematology and Oncology

## 2018-06-07 ENCOUNTER — Inpatient Hospital Stay: Payer: Medicare Other | Attending: Hematology and Oncology | Admitting: Hematology and Oncology

## 2018-06-07 DIAGNOSIS — D721 Eosinophilia, unspecified: Secondary | ICD-10-CM

## 2018-06-07 DIAGNOSIS — Z7982 Long term (current) use of aspirin: Secondary | ICD-10-CM | POA: Diagnosis not present

## 2018-06-07 DIAGNOSIS — Z79899 Other long term (current) drug therapy: Secondary | ICD-10-CM | POA: Diagnosis not present

## 2018-06-07 LAB — CBC WITH DIFFERENTIAL (CANCER CENTER ONLY)
Abs Immature Granulocytes: 0.01 10*3/uL (ref 0.00–0.07)
Basophils Absolute: 0 10*3/uL (ref 0.0–0.1)
Basophils Relative: 0 %
Eosinophils Absolute: 6.9 10*3/uL — ABNORMAL HIGH (ref 0.0–0.5)
Eosinophils Relative: 50 %
HCT: 39.8 % (ref 39.0–52.0)
Hemoglobin: 13.6 g/dL (ref 13.0–17.0)
Immature Granulocytes: 0 %
Lymphocytes Relative: 16 %
Lymphs Abs: 2.3 10*3/uL (ref 0.7–4.0)
MCH: 33.6 pg (ref 26.0–34.0)
MCHC: 34.2 g/dL (ref 30.0–36.0)
MCV: 98.3 fL (ref 80.0–100.0)
Monocytes Absolute: 0.8 10*3/uL (ref 0.1–1.0)
Monocytes Relative: 6 %
Neutro Abs: 4 10*3/uL (ref 1.7–7.7)
Neutrophils Relative %: 28 %
Platelet Count: 219 10*3/uL (ref 150–400)
RBC: 4.05 MIL/uL — ABNORMAL LOW (ref 4.22–5.81)
RDW: 13.6 % (ref 11.5–15.5)
WBC Count: 14 10*3/uL — ABNORMAL HIGH (ref 4.0–10.5)
nRBC: 0 % (ref 0.0–0.2)

## 2018-06-07 NOTE — Assessment & Plan Note (Signed)
Patient had low-grade eosinophilia in 2004 with an absolute eosinophil count of 1000. In February 2017, absolute eosinophil count was 6400 with a WBC count of 14,000. 12/04/2015: Absolute eosinophil count 5600 with a white blood cell count of 15.4 05/12/2016: Absolute eosinophil count 5831with a WBC count of 11.9 08/06/2016: Absolute eosinophil count 5300 with a white blood cell count of 11.9 06/01/2018: Absolute eosinophil count 7800 with a white blood cell count of 13.3, liver enzymes and creatinine are normal.  Differential diagnosis of eosinophilia 1. Infections: Patient does not have any evidence of infections. Strongiloides IgG negative, ESR normal 2. Inflammations: No clear-cut evidence of autoimmune diseases or inflammatory disease. Patient had heart catheterization February 2017 and underwent a stent placement. Patient had eosinophilia even prior to this. 3. Medications: Most of his new cardiac medications were started after he had known evidence of eosinophilia. 4. 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 5. Endocrine causes: Cortisol levels were normal. 6. Bone marrow disorders: Differential diagnosis hypereosinophilic syndrome, lymphoma, leukemia etc.  Bone marrow biopsy: 02/10/2016: Mildly hypocellular marrow with eosinophilia. No increase in dysplasia are blasts, nonspecific findings, cytogenetics Revealed -Y in 70% of his cells. This could be age-related versus underlying clonal abnormality.  Patient does not exhibit any evidence of organ dysfunction. I discussed with the patient the difference between hypereosinophilia and hyper eosinophilic syndrome.  Hypereosinophilic syndrome is characterized by evidence of organ dysfunction especially skin, lung and gastrointestinal tract.  Rarely cardiovascular and brain are also affected.  Bone marrow tends to have more than 20% eosinophils.    Treatment is  warranted only if he has hypereosinophilic syndrome.

## 2018-06-07 NOTE — Telephone Encounter (Signed)
Gave avs and calendar ° °

## 2018-06-07 NOTE — Progress Notes (Signed)
Patient Care Team: Lavone Orn, MD as PCP - General (Internal Medicine)  DIAGNOSIS:  Encounter Diagnosis  Name Primary?  . Eosinophilia      CHIEF COMPLIANT: Rising eosinophil levels with a new skin rash on the chest and back  INTERVAL HISTORY: Jay Wilkins is a 77 year old with a prior history of elevated eosinophil counts in the 5000s who had a bone marrow biopsy which did not show a market increase in eosinophil counts is here because his eosinophil count at the PCP office had increased up to 7500.  Accompanying this rising eosinophil count, patient had a rash on the upper back and chest wall.  He was given a topical steroids which led to a dramatic improvement in his rash.  Labs done at the same time did not show any evidence of kidney or liver dysfunction.  Patient overall does not have any excessive fatigue or any other hematological problems.  He is not anemic or thrombocytopenic.  REVIEW OF SYSTEMS:   Constitutional: Denies fevers, chills or abnormal weight loss Eyes: Denies blurriness of vision Ears, nose, mouth, throat, and face: Denies mucositis or sore throat Respiratory: Denies cough, dyspnea or wheezes Cardiovascular: Denies palpitation, chest discomfort Gastrointestinal:  Denies nausea, heartburn or change in bowel habits Skin: Denies abnormal skin rashes Lymphatics: Denies new lymphadenopathy or easy bruising Neurological:Denies numbness, tingling or new weaknesses Behavioral/Psych: Mood is stable, no new changes  Extremities: No lower extremity edema  All other systems were reviewed with the patient and are negative.  I have reviewed the past medical history, past surgical history, social history and family history with the patient and they are unchanged from previous note.  ALLERGIES:  has No Known Allergies.  MEDICATIONS:  Current Outpatient Medications  Medication Sig Dispense Refill  . acetaminophen (TYLENOL) 325 MG tablet Take 2 tablets (650 mg total)  by mouth every 4 (four) hours as needed for mild pain, moderate pain, fever or headache.    . albuterol (PROVENTIL HFA;VENTOLIN HFA) 108 (90 Base) MCG/ACT inhaler Inhale 2 puffs into the lungs every 4 (four) hours as needed for wheezing or shortness of breath. 1 Inhaler 5  . aspirin EC 81 MG tablet Take 1 tablet (81 mg total) by mouth daily. 90 tablet 3  . atorvastatin (LIPITOR) 40 MG tablet Take 1 tablet (40 mg total) by mouth daily at 6 PM. 90 tablet 3  . carvedilol (COREG) 3.125 MG tablet Take 1 tablet (3.125 mg total) by mouth 2 (two) times daily. 180 tablet 3  . cyclobenzaprine (FLEXERIL) 10 MG tablet Take 10 mg by mouth 3 (three) times daily as needed for muscle spasms.    Marland Kitchen esomeprazole (NEXIUM) 40 MG capsule Take 40 mg by mouth daily as needed (as needed for heartburn or indigestion).     . fluticasone (FLONASE) 50 MCG/ACT nasal spray Place 1 spray into both nostrils daily. Use 1 spray in the morning, uses 1 spray in evening if needed    . latanoprost (XALATAN) 0.005 % ophthalmic solution Place 1 drop into both eyes at bedtime.    . Multiple Vitamins-Minerals (CENTRUM SILVER PO) Take 1 tablet by mouth daily.    . nitroGLYCERIN (NITROSTAT) 0.4 MG SL tablet Place 1 tablet (0.4 mg total) under the tongue every 5 (five) minutes as needed for chest pain. 25 tablet 6   No current facility-administered medications for this visit.     PHYSICAL EXAMINATION: ECOG PERFORMANCE STATUS: 1 - Symptomatic but completely ambulatory  Vitals:   06/07/18 1452  BP: 133/77  Pulse: 63  Resp: 17  Temp: (!) 97.5 F (36.4 C)  SpO2: 100%   Filed Weights   06/07/18 1452  Weight: 167 lb 3.2 oz (75.8 kg)    GENERAL:alert, no distress and comfortable SKIN: skin color, texture, turgor are normal, no rashes or significant lesions EYES: normal, Conjunctiva are pink and non-injected, sclera clear OROPHARYNX:no exudate, no erythema and lips, buccal mucosa, and tongue normal  NECK: supple, thyroid normal size,  non-tender, without nodularity LYMPH:  no palpable lymphadenopathy in the cervical, axillary or inguinal LUNGS: clear to auscultation and percussion with normal breathing effort HEART: regular rate & rhythm and no murmurs and no lower extremity edema ABDOMEN:abdomen soft, non-tender and normal bowel sounds MUSCULOSKELETAL:no cyanosis of digits and no clubbing  NEURO: alert & oriented x 3 with fluent speech, no focal motor/sensory deficits EXTREMITIES: No lower extremity edema   LABORATORY DATA:  I have reviewed the data as listed CMP Latest Ref Rng & Units 01/11/2018 07/17/2017 04/23/2017  Glucose 65 - 99 mg/dL 86 100(H) 101(H)  BUN 8 - 27 mg/dL '18 17 17  '$ Creatinine 0.76 - 1.27 mg/dL 0.88 0.99 1.01  Sodium 134 - 144 mmol/L 135 134(L) 138  Potassium 3.5 - 5.2 mmol/L 4.4 3.8 4.9  Chloride 96 - 106 mmol/L 97 100(L) 99  CO2 20 - 29 mmol/L '23 25 24  '$ Calcium 8.6 - 10.2 mg/dL 8.9 8.8(L) 9.2  Total Protein 6.0 - 8.5 g/dL 6.1 6.6 6.0  Total Bilirubin 0.0 - 1.2 mg/dL 0.8 1.7(H) 0.8  Alkaline Phos 39 - 117 IU/L 63 51 59  AST 0 - 40 IU/L '20 19 20  '$ ALT 0 - 44 IU/L '9 17 13    '$ Lab Results  Component Value Date   WBC 14.0 (H) 06/07/2018   HGB 13.6 06/07/2018   HCT 39.8 06/07/2018   MCV 98.3 06/07/2018   PLT 219 06/07/2018   NEUTROABS 4.0 06/07/2018    ASSESSMENT & PLAN:  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 In February 2017, absolute eosinophil count was 6400 with a WBC count of 14,000. 12/04/2015: Absolute eosinophil count 5600 with a white blood cell count of 15.4 05/12/2016: Absolute eosinophil count 5831with a WBC count of 11.9 08/06/2016: Absolute eosinophil count 5300 with a white blood cell count of 11.9 06/01/2018: Absolute eosinophil count 7800 with a white blood cell count of 13.3, liver enzymes and creatinine are normal.  Differential diagnosis of eosinophilia 1. Infections: Patient does not have any evidence of  infections. Strongiloides IgG negative, ESR normal 2. Inflammations: No clear-cut evidence of autoimmune diseases or inflammatory disease. Patient had heart catheterization February 2017 and underwent a stent placement. Patient had eosinophilia even prior to this. 3. Medications: Most of his new cardiac medications were started after he had known evidence of eosinophilia. 4. 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 5. Endocrine causes: Cortisol levels were normal. 6. Bone marrow disorders: Differential diagnosis hypereosinophilic syndrome, lymphoma, leukemia etc.  Bone marrow biopsy: 02/10/2016: Mildly hypocellular marrow with eosinophilia. No increase in dysplasia are blasts, nonspecific findings, cytogenetics Revealed -Y in 70% of his cells. This could be age-related versus underlying clonal abnormality. ----------------------------------------------------------------------------------------------------------------------------------------------------------------------------- Patient developed a rash on the upper chest and upper back. 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  Hypereosinophilic syndrome is characterized by evidence of organ dysfunction especially skin, lung and gastrointestinal tract.  Rarely cardiovascular and brain are also affected.  Bone marrow tends to have more than 20% eosinophils.    Lab review: Today's eosinophil count 6900  Treatment is warranted for hypereosinophilic syndrome. However because his symptoms are so minor and the fact that the responded to topical steroids, I did not recommend systemic steroids at this time.  Future options for treatment: Systemic steroids versus imatinib  I would like to see him back in 3 months with labs and  follow-up.  We will watch his numbers closely.    Orders Placed This Encounter  Procedures  . CBC with Differential (Cancer Center Only)    Standing Status:   Standing    Number of Occurrences:   10    Standing Expiration Date:   06/08/2019   The patient has a good understanding of the overall plan. he agrees with it. he will call with any problems that may develop before the next visit here.   Harriette Ohara, MD 06/07/18

## 2018-06-08 DIAGNOSIS — H40013 Open angle with borderline findings, low risk, bilateral: Secondary | ICD-10-CM | POA: Diagnosis not present

## 2018-06-21 IMAGING — DX DG CHEST 2V
2 series · 2 of 2 positions shown · non-contrast
Comparison: CT chest 08/05/2016

CLINICAL DATA: Near syncopal episode this evening. Worsening cough
and diarrhea this week.

EXAM:
CHEST  2 VIEW

[chest pa]
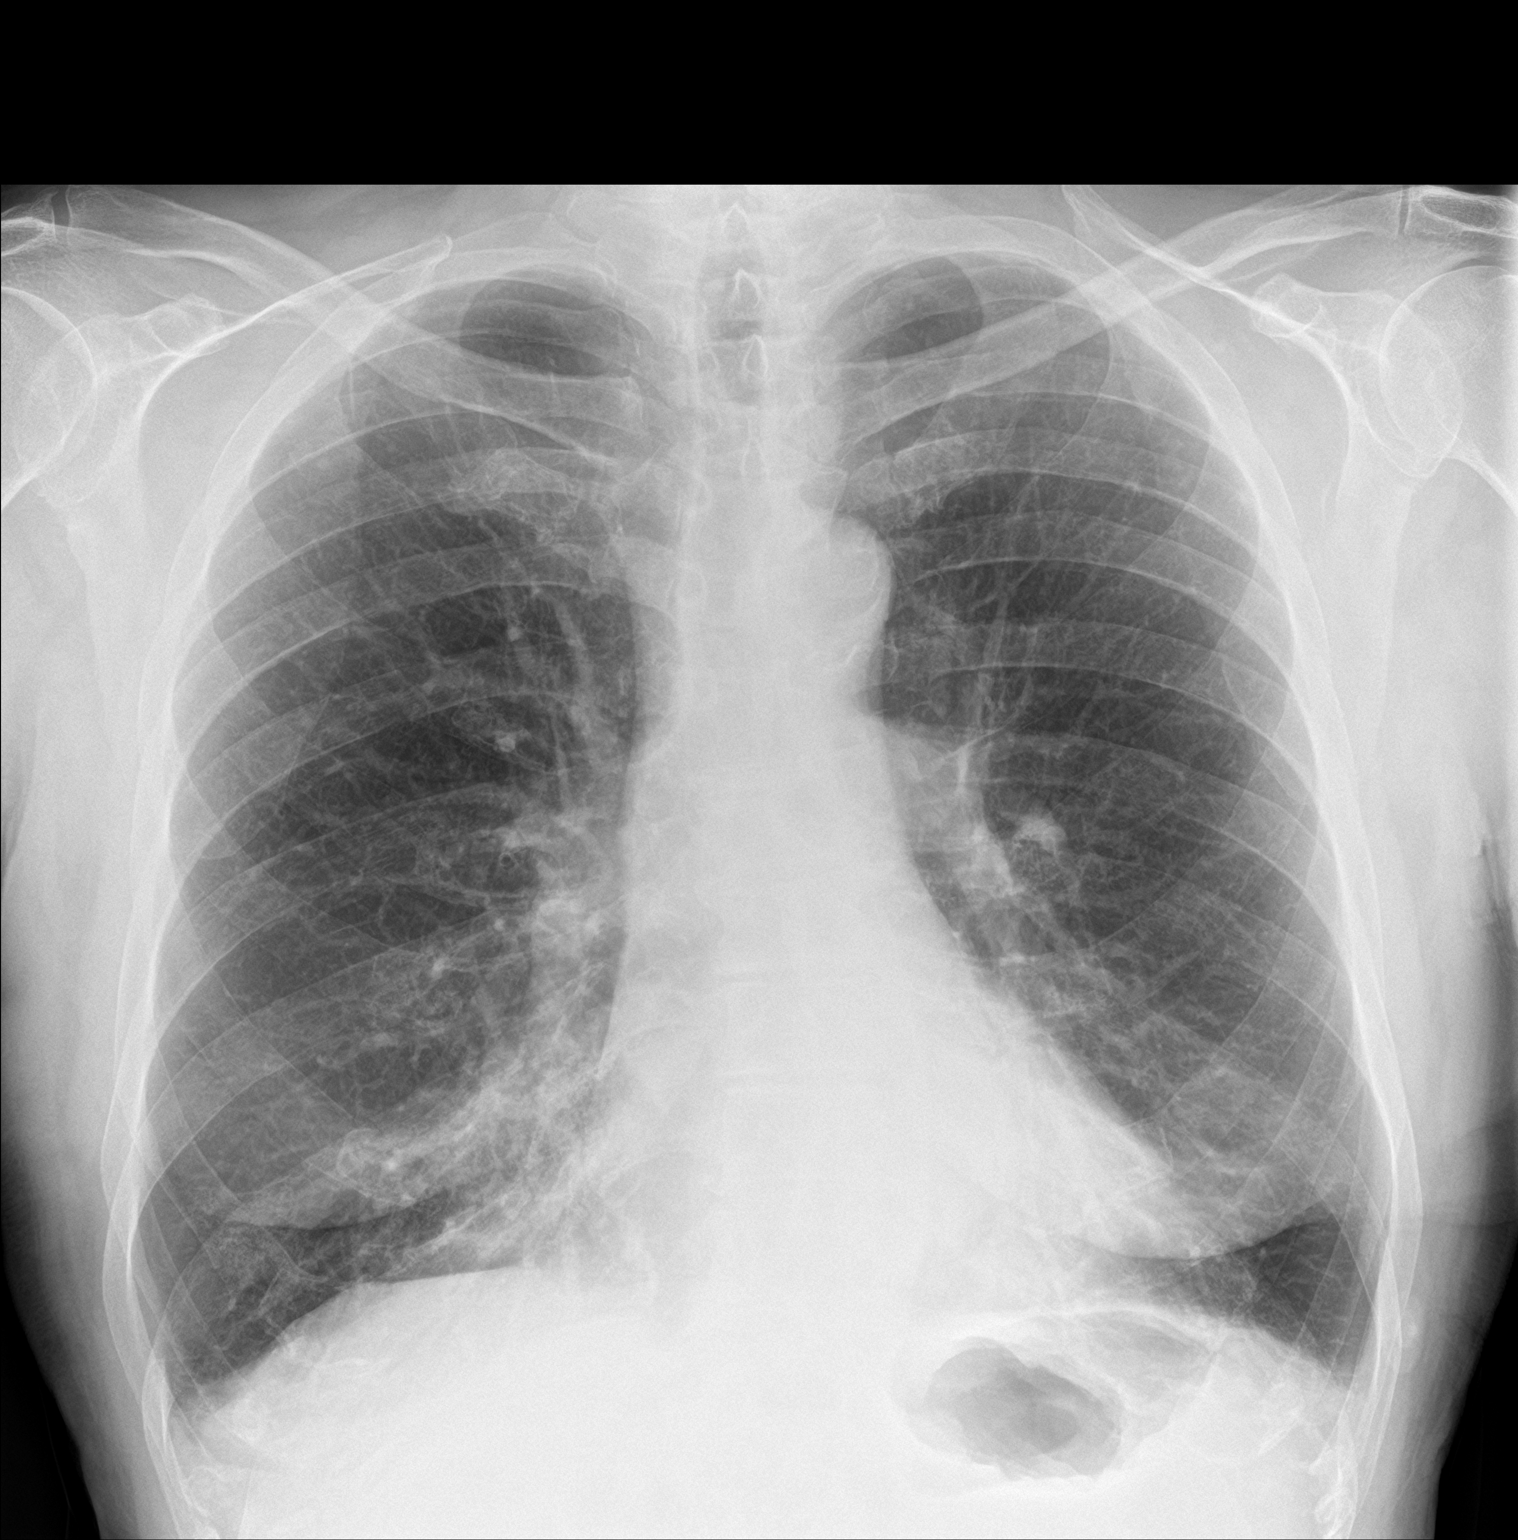

[chest lat]
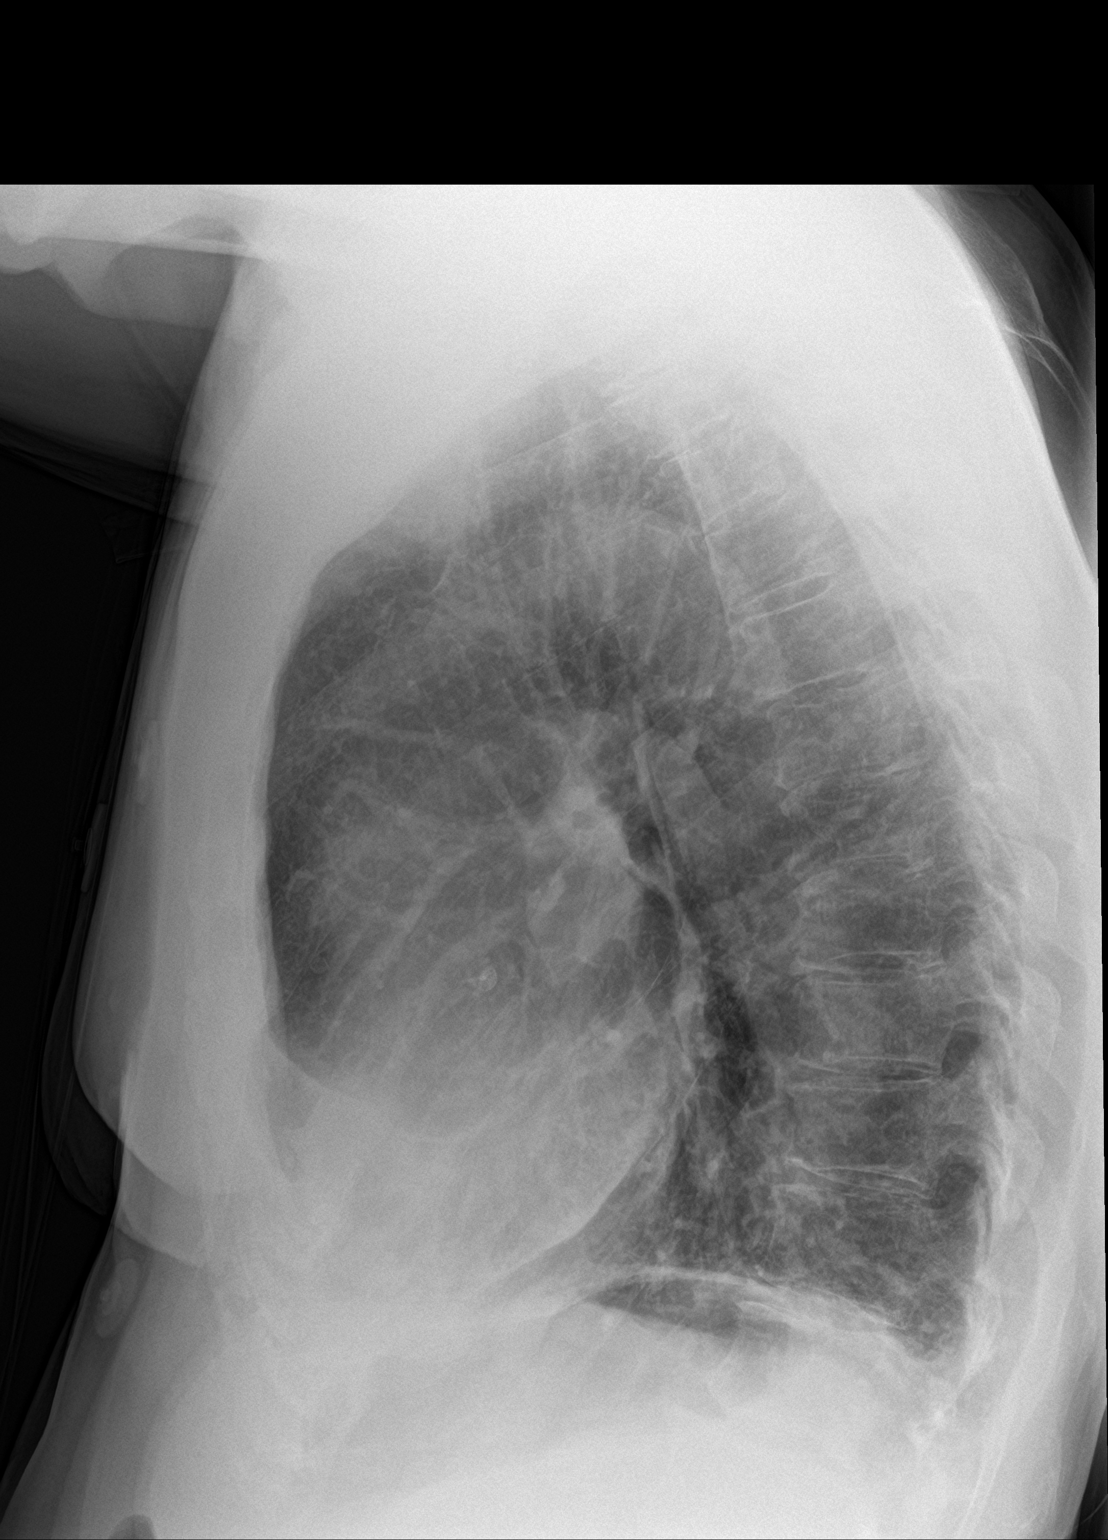

[2 of 2 positions shown; findings below may reference images not displayed]

FINDINGS: Emphysematous changes and scattered fibrosis in the lungs. Focal
opacity in the right middle lung medially, likely representing focal
pneumonia. No blunting of costophrenic angles. No pneumothorax.
Heart size and pulmonary vascularity are normal. Mediastinal
contours appear intact. Calcification of the aorta. Degenerative
changes in the spine.
IMPRESSION: Emphysematous changes in the lungs. Focal infiltration in the right
middle lung is likely pneumonia.

## 2018-07-10 IMAGING — DX DG CHEST 2V
2 series · 2 of 2 positions shown · non-contrast
Comparison: 07/17/2017

CLINICAL DATA: Emphysema, chronic cough

EXAM:
CHEST  2 VIEW

[chest pa]
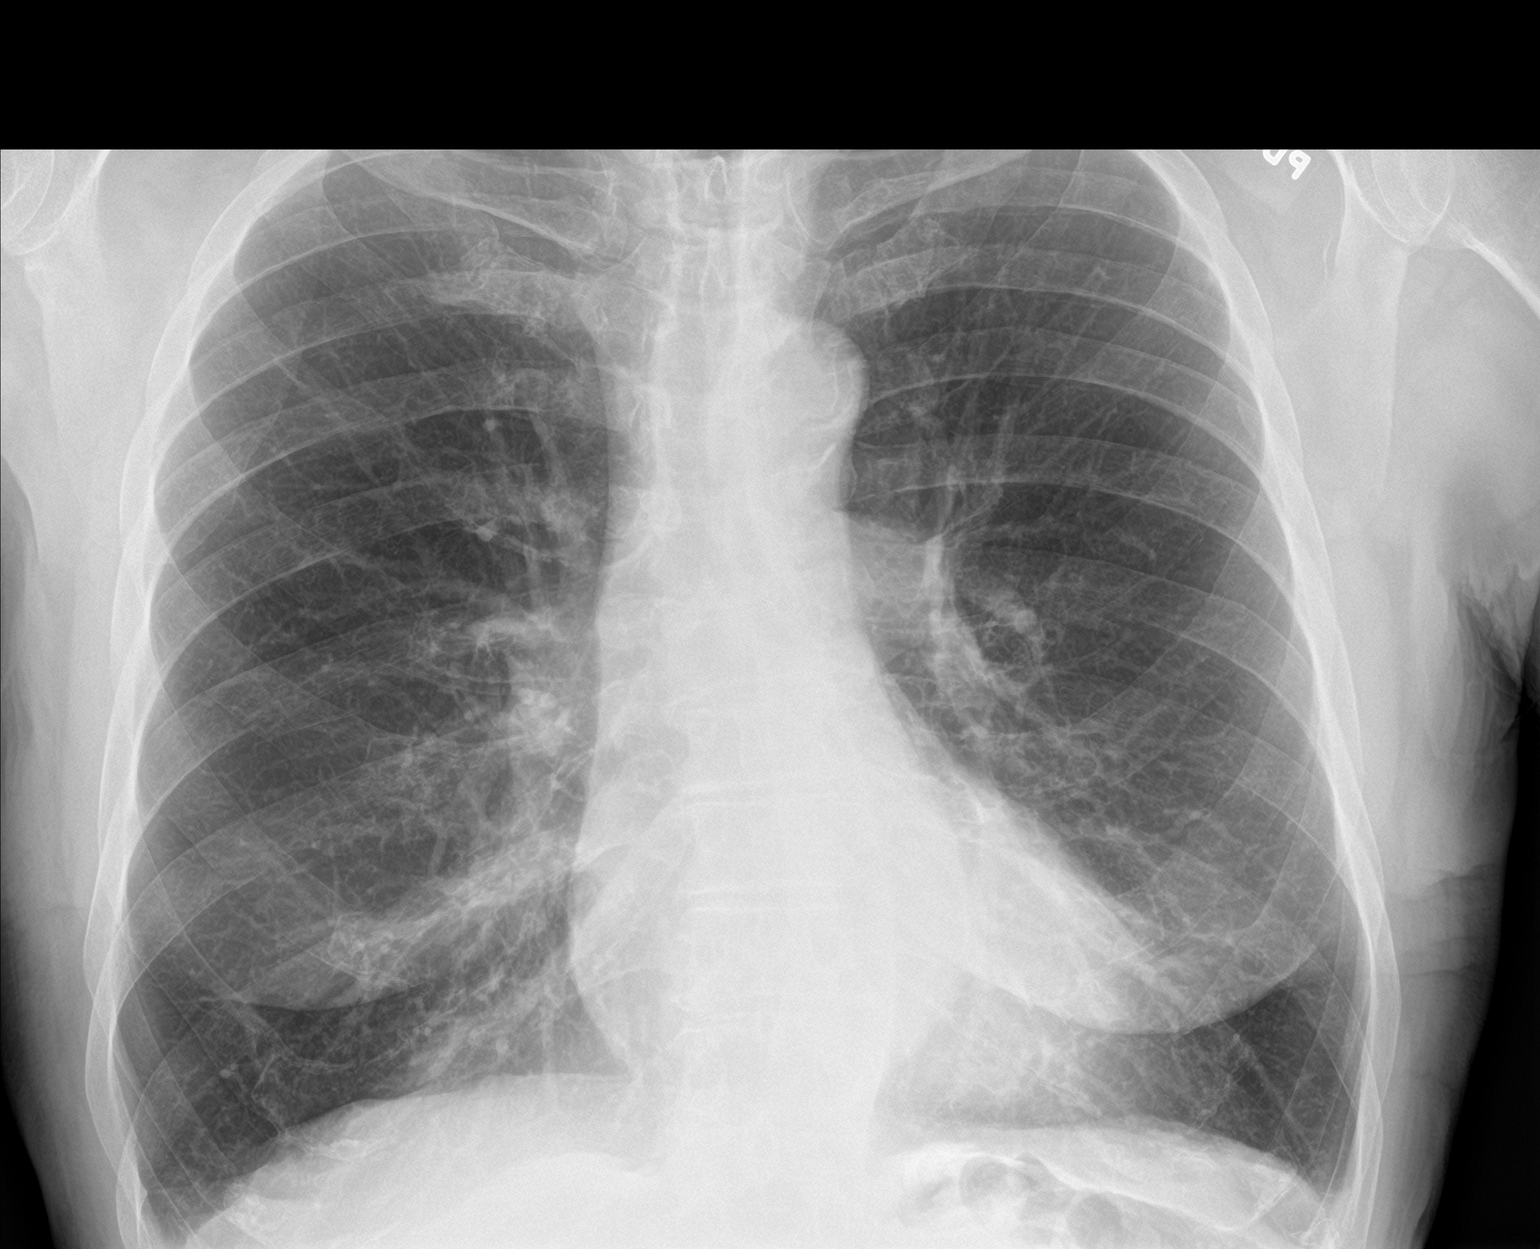

[chest lat]
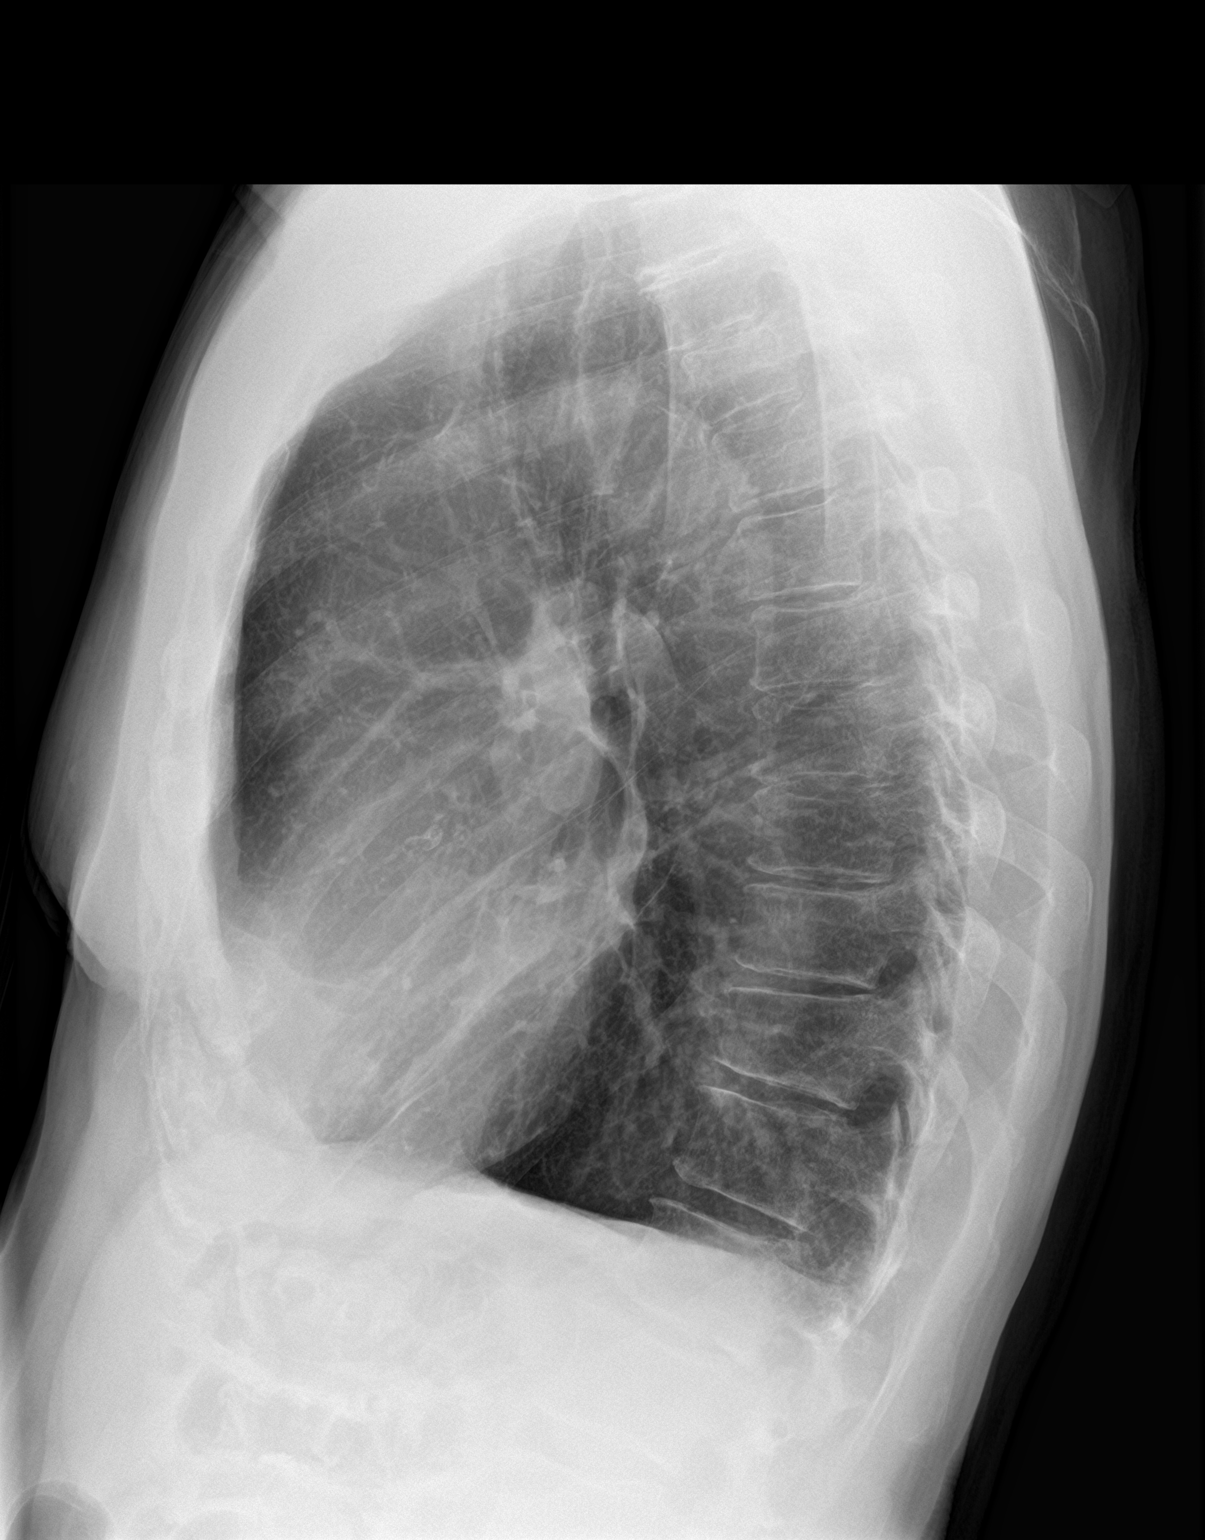

[2 of 2 positions shown; findings below may reference images not displayed]

FINDINGS: There is hyperinflation of the lungs compatible with COPD. Heart and
mediastinal contours are within normal limits. No focal opacities or
effusions. No acute bony abnormality.
IMPRESSION: COPD.  No active disease.

## 2018-07-16 ENCOUNTER — Ambulatory Visit (HOSPITAL_COMMUNITY)
Admission: EM | Admit: 2018-07-16 | Discharge: 2018-07-16 | Disposition: A | Discharge status: Home / Self Care | Payer: Medicare Other

## 2018-07-16 ENCOUNTER — Encounter (HOSPITAL_COMMUNITY): Payer: Self-pay

## 2018-07-16 DIAGNOSIS — R030 Elevated blood-pressure reading, without diagnosis of hypertension: Secondary | ICD-10-CM

## 2018-07-16 NOTE — Discharge Instructions (Addendum)
If blood pressure remains greater than 150/90, follow-up with cardiology and avoid exercise until evaluated further. No changes in medications today.
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
## 2018-07-16 NOTE — ED Triage Notes (Signed)
Pt presents with elevated blood pressure.

## 2018-08-08 DIAGNOSIS — L820 Inflamed seborrheic keratosis: Secondary | ICD-10-CM | POA: Diagnosis not present

## 2018-08-08 DIAGNOSIS — L821 Other seborrheic keratosis: Secondary | ICD-10-CM | POA: Diagnosis not present

## 2018-08-08 DIAGNOSIS — L570 Actinic keratosis: Secondary | ICD-10-CM | POA: Diagnosis not present

## 2018-08-08 DIAGNOSIS — D1801 Hemangioma of skin and subcutaneous tissue: Secondary | ICD-10-CM | POA: Diagnosis not present

## 2018-08-08 DIAGNOSIS — L853 Xerosis cutis: Secondary | ICD-10-CM | POA: Diagnosis not present

## 2018-08-08 DIAGNOSIS — Z85828 Personal history of other malignant neoplasm of skin: Secondary | ICD-10-CM | POA: Diagnosis not present

## 2018-09-05 ENCOUNTER — Telehealth: Payer: Self-pay | Admitting: Hematology and Oncology

## 2018-09-05 NOTE — Telephone Encounter (Signed)
Called pt re appt being moved due to [facility] - spoke with patient and confirmed appt.

## 2018-09-06 NOTE — Progress Notes (Signed)
Patient Care Team: Lavone Orn, MD as PCP - General (Internal Medicine)  DIAGNOSIS: Eosinophilia   CHIEF COMPLIANT: Follow-up of Eosinophilia  INTERVAL HISTORY: Jay Wilkins is a 78 y.o. with a prior history of elevated eosinophil counts in the 5000s who had a bone marrow biopsy which did not show a marked increase in eosinophil counts. He presents to the clinic today to review recent labs and evaluate if treatment is neccessary. He reports his skin rash present at the last visit resolved quickly. He reports lower right leg pain he will see his orthopedic doctor for. His bp has been high recently which he will discuss with his PCP. He says that for four days in a row his fingertips were blue after eating, which has since resolved, and he is unsure of the cause. He reviewed his medication list with me.   REVIEW OF SYSTEMS:   Constitutional: Denies fevers, chills or abnormal weight loss Eyes: Denies blurriness of vision Ears, nose, mouth, throat, and face: Denies mucositis or sore throat Respiratory: Denies cough, dyspnea or wheezes Cardiovascular: Denies palpitation, chest discomfort Gastrointestinal:  Denies nausea, heartburn or change in bowel habits Skin: Denies abnormal skin rashes MSK: (+) lower right leg pain Lymphatics: Denies new lymphadenopathy or easy bruising (+) blue fingertips after eating, resolved Neurological: Denies numbness, tingling or new weaknesses  Behavioral/Psych: Mood is stable, no new changes  Extremities: No lower extremity edema All other systems were reviewed with the patient and are negative.  I have reviewed the past medical history, past surgical history, social history and family history with the patient and they are unchanged from previous note.  ALLERGIES:  has No Known Allergies.  MEDICATIONS:  Current Outpatient Medications  Medication Sig Dispense Refill  . acetaminophen (TYLENOL) 325 MG tablet Take 2 tablets (650 mg total) by mouth every 4  (four) hours as needed for mild pain, moderate pain, fever or headache.    . albuterol (PROVENTIL HFA;VENTOLIN HFA) 108 (90 Base) MCG/ACT inhaler Inhale 2 puffs into the lungs every 4 (four) hours as needed for wheezing or shortness of breath. 1 Inhaler 5  . aspirin EC 81 MG tablet Take 1 tablet (81 mg total) by mouth daily. 90 tablet 3  . atorvastatin (LIPITOR) 40 MG tablet Take 1 tablet (40 mg total) by mouth daily at 6 PM. 90 tablet 3  . carvedilol (COREG) 3.125 MG tablet Take 1 tablet (3.125 mg total) by mouth 2 (two) times daily. 180 tablet 3  . cyclobenzaprine (FLEXERIL) 10 MG tablet Take 10 mg by mouth 3 (three) times daily as needed for muscle spasms.    Marland Kitchen esomeprazole (NEXIUM) 40 MG capsule Take 40 mg by mouth daily as needed (as needed for heartburn or indigestion).     . fluticasone (FLONASE) 50 MCG/ACT nasal spray Place 1 spray into both nostrils daily. Use 1 spray in the morning, uses 1 spray in evening if needed    . latanoprost (XALATAN) 0.005 % ophthalmic solution Place 1 drop into both eyes at bedtime.    . Multiple Vitamins-Minerals (CENTRUM SILVER PO) Take 1 tablet by mouth daily.    . nitroGLYCERIN (NITROSTAT) 0.4 MG SL tablet Place 1 tablet (0.4 mg total) under the tongue every 5 (five) minutes as needed for chest pain. 25 tablet 6   No current facility-administered medications for this visit.     PHYSICAL EXAMINATION: ECOG PERFORMANCE STATUS: 0 - Asymptomatic  Vitals:   09/07/18 1436  BP: (!) 141/82  Pulse: 63  Resp: 20  Temp: (!) 97.5 F (36.4 C)  SpO2: 100%   Filed Weights   09/07/18 1436  Weight: 168 lb 1.6 oz (76.2 kg)    GENERAL:alert, no distress and comfortable SKIN: skin color, texture, turgor are normal, no rashes or significant lesions EYES: normal, Conjunctiva are pink and non-injected, sclera clear OROPHARYNX:no exudate, no erythema and lips, buccal mucosa, and tongue normal  NECK: supple, thyroid normal size, non-tender, without  nodularity LYMPH:  no palpable lymphadenopathy in the cervical, axillary or inguinal LUNGS: clear to auscultation and percussion with normal breathing effort HEART: regular rate & rhythm and no murmurs and no lower extremity edema ABDOMEN: abdomen soft, non-tender and normal bowel sounds MUSCULOSKELETAL:no cyanosis of digits and no clubbing  NEURO: alert & oriented x 3 with fluent speech, no focal motor/sensory deficits EXTREMITIES: No lower extremity edema  LABORATORY DATA:  I have reviewed the data as listed CMP Latest Ref Rng & Units 01/11/2018 07/17/2017 04/23/2017  Glucose 65 - 99 mg/dL 86 100(H) 101(H)  BUN 8 - 27 mg/dL '18 17 17  '$ Creatinine 0.76 - 1.27 mg/dL 0.88 0.99 1.01  Sodium 134 - 144 mmol/L 135 134(L) 138  Potassium 3.5 - 5.2 mmol/L 4.4 3.8 4.9  Chloride 96 - 106 mmol/L 97 100(L) 99  CO2 20 - 29 mmol/L '23 25 24  '$ Calcium 8.6 - 10.2 mg/dL 8.9 8.8(L) 9.2  Total Protein 6.0 - 8.5 g/dL 6.1 6.6 6.0  Total Bilirubin 0.0 - 1.2 mg/dL 0.8 1.7(H) 0.8  Alkaline Phos 39 - 117 IU/L 63 51 59  AST 0 - 40 IU/L '20 19 20  '$ ALT 0 - 44 IU/L '9 17 13    '$ Lab Results  Component Value Date   WBC 14.0 (H) 06/07/2018   HGB 13.6 06/07/2018   HCT 39.8 06/07/2018   MCV 98.3 06/07/2018   PLT 219 06/07/2018   NEUTROABS 4.0 06/07/2018    ASSESSMENT & PLAN:  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 In February 2017, absolute eosinophil count was 6400 with a WBC count of 14,000. 12/04/2015: Absolute eosinophil count 5600 with a white blood cell count of 15.4 05/12/2016: Absolute eosinophil count 5831with a WBC count of 11.9 08/06/2016: Absolute eosinophil count 5300 with a white blood cell count of 11.9 06/01/2018: Absolute eosinophil count 7800 with a white blood cell count of 13.3, liver enzymes and creatinine are normal. 06/07/2018: Absolute eosinophil count 6900, WBC 14  Differential diagnosis of eosinophilia 1. Infections: Patient  does not have any evidence of infections. Strongiloides IgG negative, ESR normal 2. Inflammations: No clear-cut evidence of autoimmune diseases or inflammatory disease. Patient had heart catheterization February 2017 and underwent a stent placement. Patient had eosinophilia even prior to this. 3. Medications: Most of his new cardiac medications were started after he had known evidence of eosinophilia. 4. 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 5. Endocrine causes: Cortisol levels were normal. 6. Bone marrow disorders: Differential diagnosis hypereosinophilic syndrome, lymphoma, leukemia etc.  Bone marrow biopsy: 02/10/2016: Mildly hypocellular marrow with eosinophilia. No increase in dysplasia are blasts, nonspecific findings, cytogeneticsRevealed-Y in 70% of his cells. This could be age-related versus underlying clonal abnormality. ----------------------------------------------------------------------------------------------------------------------------------------------------------------------------- Lab review: Today's eosinophil count 6900  Treatment is warranted for hypereosinophilic syndrome. However because his symptoms are so minor and the fact that the responded to topical steroids, I did not recommend systemic steroids at this  time.  If his eosinophil count increases over 10,000 and we may consider treatment.  Future options for treatment: Systemic steroids versus imatinib  I would like to see him back in 1 year and we will review the labs done by his primary care physician and cardiology.    No orders of the defined types were placed in this encounter.  The patient has a good understanding of the overall plan. he agrees with it. he will call with any problems that may develop before the next visit here.  Nicholas Lose, MD 09/07/2018   I, Cloyde Reams Dorshimer, am acting as scribe for Nicholas Lose, MD.  I have reviewed the above documentation for accuracy and completeness, and I agree with the above.

## 2018-09-06 NOTE — Assessment & Plan Note (Signed)
Patient had low-grade eosinophilia in 2004 with an absolute eosinophil count of 1000. In February 2017, absolute eosinophil count was 6400 with a WBC count of 14,000. 12/04/2015: Absolute eosinophil count 5600 with a white blood cell count of 15.4 05/12/2016: Absolute eosinophil count 5831with a WBC count of 11.9 08/06/2016: Absolute eosinophil count 5300 with a white blood cell count of 11.9 06/01/2018: Absolute eosinophil count 7800 with a white blood cell count of 13.3, liver enzymes and creatinine are normal.  Differential diagnosis of eosinophilia 1. Infections: Patient does not have any evidence of infections. Strongiloides IgG negative, ESR normal 2. Inflammations: No clear-cut evidence of autoimmune diseases or inflammatory disease. Patient had heart catheterization February 2017 and underwent a stent placement. Patient had eosinophilia even prior to this. 3. Medications: Most of his new cardiac medications were started after he had known evidence of eosinophilia. 4. 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 5. Endocrine causes: Cortisol levels were normal. 6. Bone marrow disorders: Differential diagnosis hypereosinophilic syndrome, lymphoma, leukemia etc.  Bone marrow biopsy: 02/10/2016: Mildly hypocellular marrow with eosinophilia. No increase in dysplasia are blasts, nonspecific findings, cytogeneticsRevealed-Y in 70% of his cells. This could be age-related versus underlying clonal abnormality. ----------------------------------------------------------------------------------------------------------------------------------------------------------------------------- Lab review: Today's eosinophil count 6900  Treatment is warranted for hypereosinophilic syndrome. However because his symptoms are so minor and the fact that the responded to topical steroids, I did not recommend systemic steroids at this  time.  Future options for treatment: Systemic steroids versus imatinib  I would like to see him back in 3 months with labs and follow-up.

## 2018-09-07 ENCOUNTER — Telehealth: Payer: Self-pay | Admitting: Hematology and Oncology

## 2018-09-07 ENCOUNTER — Inpatient Hospital Stay: Payer: Medicare Other | Attending: Hematology and Oncology | Admitting: Hematology and Oncology

## 2018-09-07 DIAGNOSIS — D721 Eosinophilia, unspecified: Secondary | ICD-10-CM

## 2018-09-07 DIAGNOSIS — M79661 Pain in right lower leg: Secondary | ICD-10-CM

## 2018-09-07 DIAGNOSIS — Z79899 Other long term (current) drug therapy: Secondary | ICD-10-CM | POA: Insufficient documentation

## 2018-09-07 DIAGNOSIS — Z7982 Long term (current) use of aspirin: Secondary | ICD-10-CM | POA: Insufficient documentation

## 2018-09-07 NOTE — Telephone Encounter (Signed)
Gave avs and calendar ° °

## 2018-09-17 DIAGNOSIS — M5416 Radiculopathy, lumbar region: Secondary | ICD-10-CM | POA: Diagnosis not present

## 2018-09-24 DIAGNOSIS — M5416 Radiculopathy, lumbar region: Secondary | ICD-10-CM | POA: Diagnosis not present

## 2018-09-26 DIAGNOSIS — M5416 Radiculopathy, lumbar region: Secondary | ICD-10-CM | POA: Diagnosis not present

## 2018-09-28 DIAGNOSIS — M79604 Pain in right leg: Secondary | ICD-10-CM | POA: Diagnosis not present

## 2018-09-29 ENCOUNTER — Other Ambulatory Visit: Payer: Self-pay | Admitting: Internal Medicine

## 2018-09-29 DIAGNOSIS — M79604 Pain in right leg: Secondary | ICD-10-CM

## 2018-10-03 ENCOUNTER — Other Ambulatory Visit: Payer: Self-pay | Admitting: Internal Medicine

## 2018-10-05 ENCOUNTER — Ambulatory Visit
Admission: RE | Admit: 2018-10-05 | Discharge: 2018-10-05 | Disposition: A | Discharge status: Home / Self Care | Payer: Medicare Other | Source: Ambulatory Visit | Attending: Internal Medicine | Admitting: Internal Medicine

## 2018-10-05 DIAGNOSIS — M48061 Spinal stenosis, lumbar region without neurogenic claudication: Secondary | ICD-10-CM | POA: Diagnosis not present

## 2018-10-05 DIAGNOSIS — M47816 Spondylosis without myelopathy or radiculopathy, lumbar region: Secondary | ICD-10-CM | POA: Diagnosis not present

## 2018-10-05 DIAGNOSIS — M79604 Pain in right leg: Secondary | ICD-10-CM

## 2018-10-05 DIAGNOSIS — M5126 Other intervertebral disc displacement, lumbar region: Secondary | ICD-10-CM | POA: Diagnosis not present

## 2018-10-07 DIAGNOSIS — M5416 Radiculopathy, lumbar region: Secondary | ICD-10-CM | POA: Diagnosis not present

## 2018-10-10 DIAGNOSIS — M5416 Radiculopathy, lumbar region: Secondary | ICD-10-CM | POA: Diagnosis not present

## 2018-10-12 DIAGNOSIS — M5416 Radiculopathy, lumbar region: Secondary | ICD-10-CM | POA: Diagnosis not present

## 2018-10-28 DIAGNOSIS — M545 Low back pain: Secondary | ICD-10-CM | POA: Diagnosis not present

## 2018-10-28 DIAGNOSIS — M5416 Radiculopathy, lumbar region: Secondary | ICD-10-CM | POA: Diagnosis not present

## 2018-11-10 DIAGNOSIS — M48061 Spinal stenosis, lumbar region without neurogenic claudication: Secondary | ICD-10-CM | POA: Diagnosis not present

## 2018-11-10 DIAGNOSIS — M79604 Pain in right leg: Secondary | ICD-10-CM | POA: Diagnosis not present

## 2018-11-14 DIAGNOSIS — M5416 Radiculopathy, lumbar region: Secondary | ICD-10-CM | POA: Diagnosis not present

## 2018-11-16 ENCOUNTER — Encounter: Payer: Self-pay | Admitting: Hematology and Oncology

## 2018-11-18 ENCOUNTER — Other Ambulatory Visit: Payer: Self-pay | Admitting: Nurse Practitioner

## 2018-11-18 MED ORDER — CARVEDILOL 3.125 MG PO TABS: 3.1250 mg | ORAL_TABLET | Freq: Two times a day (BID) | ORAL | 3 refills | Status: DC

## 2018-11-18 MED ORDER — ATORVASTATIN CALCIUM 40 MG PO TABS: 40.0000 mg | ORAL_TABLET | Freq: Every day | ORAL | 3 refills | Status: DC

## 2018-11-21 ENCOUNTER — Other Ambulatory Visit: Payer: Medicare Other

## 2018-11-23 ENCOUNTER — Ambulatory Visit: Payer: Medicare Other | Admitting: Cardiovascular Disease

## 2018-12-05 ENCOUNTER — Other Ambulatory Visit: Payer: Self-pay | Admitting: Nurse Practitioner

## 2018-12-05 DIAGNOSIS — I2510 Atherosclerotic heart disease of native coronary artery without angina pectoris: Secondary | ICD-10-CM

## 2018-12-05 DIAGNOSIS — Z9861 Coronary angioplasty status: Secondary | ICD-10-CM

## 2018-12-05 DIAGNOSIS — D721 Eosinophilia, unspecified: Secondary | ICD-10-CM

## 2018-12-05 DIAGNOSIS — I10 Essential (primary) hypertension: Secondary | ICD-10-CM

## 2018-12-05 NOTE — Addendum Note (Signed)
Addended by: Emmaline Life on: 12/05/2018 05:00 PM   Modules accepted: Orders

## 2019-01-04 DIAGNOSIS — M5416 Radiculopathy, lumbar region: Secondary | ICD-10-CM | POA: Diagnosis not present

## 2019-01-11 DIAGNOSIS — H35372 Puckering of macula, left eye: Secondary | ICD-10-CM | POA: Diagnosis not present

## 2019-01-11 DIAGNOSIS — Z961 Presence of intraocular lens: Secondary | ICD-10-CM | POA: Diagnosis not present

## 2019-01-11 DIAGNOSIS — H40013 Open angle with borderline findings, low risk, bilateral: Secondary | ICD-10-CM | POA: Diagnosis not present

## 2019-01-17 DIAGNOSIS — M5416 Radiculopathy, lumbar region: Secondary | ICD-10-CM | POA: Diagnosis not present

## 2019-01-20 ENCOUNTER — Other Ambulatory Visit: Payer: Self-pay | Admitting: Internal Medicine

## 2019-01-20 DIAGNOSIS — M79604 Pain in right leg: Secondary | ICD-10-CM

## 2019-01-20 DIAGNOSIS — M79605 Pain in left leg: Secondary | ICD-10-CM

## 2019-01-30 ENCOUNTER — Other Ambulatory Visit: Payer: Medicare Other

## 2019-01-31 DIAGNOSIS — R51 Headache: Secondary | ICD-10-CM | POA: Diagnosis not present

## 2019-02-02 ENCOUNTER — Telehealth (HOSPITAL_COMMUNITY): Payer: Self-pay | Admitting: Rehabilitation

## 2019-02-02 ENCOUNTER — Other Ambulatory Visit: Payer: Self-pay

## 2019-02-02 ENCOUNTER — Other Ambulatory Visit: Payer: Medicare Other

## 2019-02-02 DIAGNOSIS — M79661 Pain in right lower leg: Secondary | ICD-10-CM

## 2019-02-02 NOTE — Telephone Encounter (Signed)

## 2019-02-03 ENCOUNTER — Ambulatory Visit (HOSPITAL_COMMUNITY)
Admission: RE | Admit: 2019-02-03 | Discharge: 2019-02-03 | Disposition: A | Discharge status: Home / Self Care | Payer: Medicare Other | Source: Ambulatory Visit | Attending: Vascular Surgery | Admitting: Vascular Surgery

## 2019-02-03 ENCOUNTER — Ambulatory Visit (INDEPENDENT_AMBULATORY_CARE_PROVIDER_SITE_OTHER): Payer: Medicare Other | Admitting: Vascular Surgery

## 2019-02-03 ENCOUNTER — Encounter: Payer: Self-pay | Admitting: Vascular Surgery

## 2019-02-03 ENCOUNTER — Other Ambulatory Visit: Payer: Self-pay

## 2019-02-03 VITALS — BP 126/78 | HR 73 | Temp 98.5°F | Ht 67.0 in | Wt 169.0 lb

## 2019-02-03 DIAGNOSIS — M79661 Pain in right lower leg: Secondary | ICD-10-CM | POA: Diagnosis not present
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
## 2019-02-09 ENCOUNTER — Other Ambulatory Visit: Payer: Self-pay | Admitting: Orthopedic Surgery

## 2019-02-13 DIAGNOSIS — I2510 Atherosclerotic heart disease of native coronary artery without angina pectoris: Secondary | ICD-10-CM

## 2019-02-13 MED ORDER — NITROGLYCERIN 0.4 MG SL SUBL: 0.4000 mg | SUBLINGUAL_TABLET | SUBLINGUAL | 6 refills | Status: DC | PRN

## 2019-02-13 NOTE — Telephone Encounter (Signed)
NTG refill sent to [facility]

## 2019-02-14 ENCOUNTER — Telehealth: Payer: Self-pay | Admitting: Physician Assistant

## 2019-02-14 MED ORDER — NITROGLYCERIN 0.4 MG SL SUBL: 0.4000 mg | SUBLINGUAL_TABLET | SUBLINGUAL | 3 refills | Status: DC | PRN

## 2019-02-14 NOTE — Telephone Encounter (Signed)
   Pt reached out to Dr. Acie Fredrickson via MyChart to discuss pre-op clearance.  Dr. Acie Fredrickson replied with the following and sent to the preop box.  "He may hold ASA for 7 days prior to your back surgery . Jay Wilkins is at low risk for CV complications with his back surgery. Restart when the surgeon gives the OK."  I do not see formal pre-op clearance was ever routed to the pre-op box so will forward to callback staff to identify where this information can be sent to. Lethia Donlon PA-C

## 2019-02-14 NOTE — Telephone Encounter (Signed)
Surgery is with Dr. Lynann Bologna @ [facility]. Clearance has been faxed to Neita Garnet, [phone]

## 2019-02-14 NOTE — Telephone Encounter (Signed)
NTG rx sent.

## 2019-02-16 DIAGNOSIS — I2510 Atherosclerotic heart disease of native coronary artery without angina pectoris: Secondary | ICD-10-CM | POA: Diagnosis not present

## 2019-02-16 DIAGNOSIS — M5386 Other specified dorsopathies, lumbar region: Secondary | ICD-10-CM | POA: Diagnosis not present

## 2019-02-16 DIAGNOSIS — M5442 Lumbago with sciatica, left side: Secondary | ICD-10-CM | POA: Diagnosis not present

## 2019-02-16 DIAGNOSIS — J449 Chronic obstructive pulmonary disease, unspecified: Secondary | ICD-10-CM | POA: Diagnosis not present

## 2019-02-20 ENCOUNTER — Encounter (HOSPITAL_COMMUNITY)
Admission: RE | Admit: 2019-02-20 | Discharge: 2019-02-20 | Disposition: A | Discharge status: Home / Self Care | Payer: Medicare Other | Source: Ambulatory Visit | Attending: Orthopedic Surgery | Admitting: Orthopedic Surgery

## 2019-02-20 ENCOUNTER — Other Ambulatory Visit: Payer: Self-pay

## 2019-02-20 ENCOUNTER — Other Ambulatory Visit (HOSPITAL_COMMUNITY)
Admission: RE | Admit: 2019-02-20 | Discharge: 2019-02-20 | Disposition: A | Discharge status: Home / Self Care | Payer: Medicare Other | Source: Ambulatory Visit | Attending: Orthopedic Surgery | Admitting: Orthopedic Surgery

## 2019-02-20 ENCOUNTER — Encounter (HOSPITAL_COMMUNITY): Payer: Self-pay

## 2019-02-20 DIAGNOSIS — Z87891 Personal history of nicotine dependence: Secondary | ICD-10-CM | POA: Diagnosis not present

## 2019-02-20 DIAGNOSIS — I252 Old myocardial infarction: Secondary | ICD-10-CM | POA: Diagnosis not present

## 2019-02-20 DIAGNOSIS — Z01810 Encounter for preprocedural cardiovascular examination: Secondary | ICD-10-CM | POA: Diagnosis not present

## 2019-02-20 DIAGNOSIS — Z01812 Encounter for preprocedural laboratory examination: Secondary | ICD-10-CM | POA: Diagnosis not present

## 2019-02-20 DIAGNOSIS — M48061 Spinal stenosis, lumbar region without neurogenic claudication: Secondary | ICD-10-CM | POA: Diagnosis not present

## 2019-02-20 DIAGNOSIS — Z79899 Other long term (current) drug therapy: Secondary | ICD-10-CM | POA: Diagnosis not present

## 2019-02-20 DIAGNOSIS — K219 Gastro-esophageal reflux disease without esophagitis: Secondary | ICD-10-CM | POA: Diagnosis not present

## 2019-02-20 DIAGNOSIS — D72829 Elevated white blood cell count, unspecified: Secondary | ICD-10-CM | POA: Diagnosis not present

## 2019-02-20 DIAGNOSIS — I10 Essential (primary) hypertension: Secondary | ICD-10-CM | POA: Diagnosis not present

## 2019-02-20 DIAGNOSIS — Z1159 Encounter for screening for other viral diseases: Secondary | ICD-10-CM | POA: Diagnosis not present

## 2019-02-20 DIAGNOSIS — Z8546 Personal history of malignant neoplasm of prostate: Secondary | ICD-10-CM | POA: Diagnosis not present

## 2019-02-20 DIAGNOSIS — Z955 Presence of coronary angioplasty implant and graft: Secondary | ICD-10-CM | POA: Diagnosis not present

## 2019-02-20 DIAGNOSIS — Z7982 Long term (current) use of aspirin: Secondary | ICD-10-CM | POA: Diagnosis not present

## 2019-02-20 DIAGNOSIS — J449 Chronic obstructive pulmonary disease, unspecified: Secondary | ICD-10-CM | POA: Diagnosis not present

## 2019-02-20 DIAGNOSIS — E785 Hyperlipidemia, unspecified: Secondary | ICD-10-CM | POA: Diagnosis not present

## 2019-02-20 DIAGNOSIS — M4807 Spinal stenosis, lumbosacral region: Secondary | ICD-10-CM | POA: Diagnosis not present

## 2019-02-20 DIAGNOSIS — I2510 Atherosclerotic heart disease of native coronary artery without angina pectoris: Secondary | ICD-10-CM | POA: Diagnosis not present

## 2019-02-20 DIAGNOSIS — M5416 Radiculopathy, lumbar region: Secondary | ICD-10-CM | POA: Diagnosis present

## 2019-02-20 HISTORY — DX: Atherosclerotic heart disease of native coronary artery without angina pectoris: I25.10

## 2019-02-20 LAB — COMPREHENSIVE METABOLIC PANEL
ALT: 19 U/L (ref 0–44)
AST: 20 U/L (ref 15–41)
Albumin: 3.6 g/dL (ref 3.5–5.0)
Alkaline Phosphatase: 56 U/L (ref 38–126)
Anion gap: 8 (ref 5–15)
BUN: 16 mg/dL (ref 8–23)
CO2: 28 mmol/L (ref 22–32)
Calcium: 9.4 mg/dL (ref 8.9–10.3)
Chloride: 98 mmol/L (ref 98–111)
Creatinine, Ser: 0.86 mg/dL (ref 0.61–1.24)
GFR calc Af Amer: >60 mL/min (ref >60)
GFR calc non Af Amer: >60 mL/min (ref >60)
Glucose, Bld: 100 mg/dL — ABNORMAL HIGH (ref 70–99)
Potassium: 4.6 mmol/L (ref 3.5–5.1)
Sodium: 134 mmol/L — ABNORMAL LOW (ref 135–145)
Total Bilirubin: 1.4 mg/dL — ABNORMAL HIGH (ref 0.3–1.2)
Total Protein: 6.3 g/dL — ABNORMAL LOW (ref 6.5–8.1)

## 2019-02-20 LAB — TYPE AND SCREEN
ABO/RH(D): A POS
Antibody Screen: NEGATIVE

## 2019-02-20 LAB — CBC WITH DIFFERENTIAL/PLATELET
Abs Immature Granulocytes: 0 10*3/uL (ref 0.00–0.07)
Basophils Absolute: 0 10*3/uL (ref 0.0–0.1)
Basophils Relative: 0 %
Eosinophils Absolute: 5.7 10*3/uL — ABNORMAL HIGH (ref 0.0–0.5)
Eosinophils Relative: 41 %
HCT: 44.4 % (ref 39.0–52.0)
Hemoglobin: 15.4 g/dL (ref 13.0–17.0)
Lymphocytes Relative: 11 %
Lymphs Abs: 1.5 10*3/uL (ref 0.7–4.0)
MCH: 33.6 pg (ref 26.0–34.0)
MCHC: 34.7 g/dL (ref 30.0–36.0)
MCV: 96.9 fL (ref 80.0–100.0)
Monocytes Absolute: 0.8 10*3/uL (ref 0.1–1.0)
Monocytes Relative: 6 %
Neutro Abs: 5.8 10*3/uL (ref 1.7–7.7)
Neutrophils Relative %: 42 %
Platelets: 225 10*3/uL (ref 150–400)
RBC: 4.58 MIL/uL (ref 4.22–5.81)
RDW: 13.6 % (ref 11.5–15.5)
WBC: 13.8 10*3/uL — ABNORMAL HIGH (ref 4.0–10.5)
nRBC: 0 % (ref 0.0–0.2)

## 2019-02-20 LAB — SARS CORONAVIRUS 2 (TAT 6-24 HRS): SARS Coronavirus 2: NEGATIVE

## 2019-02-20 LAB — ABO/RH: ABO/RH(D): A POS

## 2019-02-20 LAB — URINALYSIS, ROUTINE W REFLEX MICROSCOPIC
Bilirubin Urine: NEGATIVE
Glucose, UA: NEGATIVE mg/dL
Hgb urine dipstick: NEGATIVE
Ketones, ur: 5 mg/dL — AB
Leukocytes,Ua: NEGATIVE
Nitrite: NEGATIVE
Protein, ur: NEGATIVE mg/dL
Specific Gravity, Urine: 1.014 (ref 1.005–1.030)
pH: 7 (ref 5.0–8.0)

## 2019-02-20 LAB — SURGICAL PCR SCREEN
MRSA, PCR: NEGATIVE
Staphylococcus aureus: NEGATIVE

## 2019-02-20 LAB — PROTIME-INR
INR: 1.2 (ref 0.8–1.2)
Prothrombin Time: 14.6 seconds (ref 11.4–15.2)

## 2019-02-20 LAB — APTT: aPTT: 34 seconds (ref 24–36)

## 2019-02-20 NOTE — Progress Notes (Signed)
PCP - Lavone Orn Cardiologist - Nas[place]  Chest x-ray - not needed EKG - 02/20/19 Stress Test - 2017 ECHO - denies Cardiac Cath - 2017   Aspirin Instructions: last dose 6/26  Anesthesia review: ys, hx CAD  Patient denies shortness of breath, fever, cough and chest pain at PAT appointment   Patient verbalized understanding of instructions that were given to them at the PAT appointment. Patient was also instructed that they will need to review over the PAT instructions again at home before surgery.

## 2019-02-20 NOTE — Progress Notes (Signed)
[facility] [address] [place] [address] Phone: [phone] Fax: [phone]  [facility] [place] [facility] Portal [facility] [place] [address] Phone: [phone] Fax: [phone]      Your procedure is scheduled on July 2  Report to [facility] Main Entrance "A" at 0530 A.M., and check in at the Admitting office.  Call this number if you have problems the morning of surgery:  [phone]  Call [phone] if you have any questions prior to your surgery date Monday-Friday 8am-4pm    Remember:  Do not eat after midnight.  You may drink clear liquids until 0430 am the morning of surgery.   Clear liquids allowed are: Water, Non-Citrus Juices (without pulp), Carbonated Beverages, Clear Tea, Black Coffee Only, and Gatorade  Please complete your PRE-SURGERY ENSURE that was provided to you by 0430 am the morning of surgery.  Please, if able, drink it in one setting. DO NOT SIP.     Take these medicines the morning of surgery with A SIP OF WATER  acetaminophen (TYLENOL) if needed albuterol (PROVENTIL HFA;VENTOLIN HFA) if needed, Please bring all inhalers with you the day of surgery.  carvedilol (COREG)  cyclobenzaprine (FLEXERIL)  If needed esomeprazole (NEXIUM fluticasone (FLONASE) if needed nitroGLYCERIN (NITROSTAT) if needed for chest pain  Follow your surgeon's instructions on when to stop Aspirin.  If no instructions were given by your surgeon then you will need to call the office to get those instructions.    7 days prior to surgery STOP taking any  Aleve, Naproxen, Ibuprofen, Motrin, Advil, Goody's, BC's, all herbal medications, fish oil, and all vitamins.    The Morning of Surgery  Do not wear jewelry  Do not wear lotions, powders, or perfumes/colognes, or deodorant  Do not shave 48 hours prior to  surgery.  Men may shave face and neck.  Do not bring valuables to the hospital.  [facility] is not responsible for any belongings or valuables.  If you are a smoker, DO NOT Smoke 24 hours prior to surgery IF you wear a CPAP at night please bring your mask, tubing, and machine the morning of surgery   Remember that you must have someone to transport you home after your surgery, and remain with you for 24 hours if you are discharged the same day.   Contacts, glasses, hearing aids, dentures or bridgework may not be worn into surgery.    Leave your suitcase in the car.  After surgery it may be brought to your room.  For patients admitted to the hospital, discharge time will be determined by your treatment team.  Patients discharged the day of surgery will not be allowed to drive home.    Special instructions:   [place]- Preparing For Surgery  Before surgery, you can play an important role. Because skin is not sterile, your skin needs to be as free of germs as possible. You can reduce the number of germs on your skin by washing with CHG (chlorahexidine gluconate) Soap before surgery.  CHG is an antiseptic cleaner which kills germs and bonds with the skin to continue killing germs even after washing.    Oral Hygiene is also important to reduce your risk of infection.  Remember - BRUSH YOUR TEETH THE MORNING OF SURGERY WITH YOUR REGULAR TOOTHPASTE  Please do not use if you  have an allergy to CHG or antibacterial soaps. If your skin becomes reddened/irritated stop using the CHG.  Do not shave (including legs and underarms) for at least 48 hours prior to first CHG shower. It is OK to shave your face.  Please follow these instructions carefully.   1. Shower the NIGHT BEFORE SURGERY and the MORNING OF SURGERY with CHG Soap.   2. If you chose to wash your hair, wash your hair first as usual with your normal shampoo.  3. After you shampoo, rinse your hair and body thoroughly to remove the  shampoo.  4. Use CHG as you would any other liquid soap. You can apply CHG directly to the skin and wash gently with a scrungie or a clean washcloth.   5. Apply the CHG Soap to your body ONLY FROM THE NECK DOWN.  Do not use on open wounds or open sores. Avoid contact with your eyes, ears, mouth and genitals (private parts). Wash Face and genitals (private parts)  with your normal soap.   6. Wash thoroughly, paying special attention to the area where your surgery will be performed.  7. Thoroughly rinse your body with warm water from the neck down.  8. DO NOT shower/wash with your normal soap after using and rinsing off the CHG Soap.  9. Pat yourself dry with a CLEAN TOWEL.  10. Wear CLEAN PAJAMAS to bed the night before surgery, wear comfortable clothes the morning of surgery  11. Place CLEAN SHEETS on your bed the night of your first shower and DO NOT SLEEP WITH PETS.    Day of Surgery:  Do not apply any deodorants/lotions.  Please wear clean clothes to the hospital/surgery center.   Remember to brush your teeth WITH YOUR REGULAR TOOTHPASTE.   Please read over the following fact sheets that you were given.

## 2019-02-21 LAB — PATHOLOGIST SMEAR REVIEW: Path Review: ELEVATED

## 2019-02-21 NOTE — Anesthesia Preprocedure Evaluation (Addendum)
Anesthesia Evaluation  Patient identified by MRN, date of birth, ID band Patient awake    Reviewed: Allergy & Precautions, NPO status , Patient's Chart, lab work & pertinent test results  Airway Mallampati: II  TM Distance: >3 FB Neck ROM: Full    Dental no notable dental hx. (+) Teeth Intact, Dental Advisory Given   Pulmonary COPD, former smoker,    Pulmonary exam normal breath sounds clear to auscultation       Cardiovascular hypertension, Pt. on medications + CAD, + Past MI and + DOE  Normal cardiovascular exam Rhythm:Regular Rate:Normal     Neuro/Psych    GI/Hepatic GERD  ,  Endo/Other    Renal/GU      Musculoskeletal   Abdominal   Peds  Hematology   Anesthesia Other Findings   Reproductive/Obstetrics                           Anesthesia Physical Anesthesia Plan  ASA: III  Anesthesia Plan: General   Post-op Pain Management:    Induction: Intravenous  PONV Risk Score and Plan: 2 and Ondansetron, Midazolam and Treatment may vary due to age or medical condition  Airway Management Planned: Oral ETT  Additional Equipment:   Intra-op Plan:   Post-operative Plan: Extubation in OR  Informed Consent: I have reviewed the patients History and Physical, chart, labs and discussed the procedure including the risks, benefits and alternatives for the proposed anesthesia with the patient or authorized representative who has indicated his/her understanding and acceptance.     Dental advisory given  Plan Discussed with: CRNA  Anesthesia Plan Comments: (Follows with cardiology for CAD - s/p PCI of his LAD  in 2017. Cleared by Dr. Acie Fredrickson 02/14/19 "He may hold ASA for 7 days prior to your back surgery. [name] is at low risk for CV complications with his back surgery. Restart when the surgeon gives the OK."  Follows with Dr. Lamonte Sakai for COPD. Significant obstruction on PFTs, however he has only  mild symptoms, remains active.   PFTs 08/10/2017  FVC-Pre: 2.40 FVC-%Pred-Pre: 63 FEV1-Pre: 1.10 FEV1-%Pred-Pre: 40 Pre FEV1/FVC ratio: 46 FEV1FVC-%Pred-Pre: 63  DLCO cor: 19.12 DLCO cor % pred: 65 DLCO unc: 18.44 DLCO unc % pred: 63   Cath 10/10/15:  1. 2 vessel obstructive CAD.     - 85% proximal LAD - focal    - 100% mid PDA 2. Mild LV dysfunction 3. Successful stenting of the proximal LAD with a DES.)       Anesthesia Quick Evaluation

## 2019-02-23 ENCOUNTER — Ambulatory Visit (HOSPITAL_COMMUNITY)
Admission: RE | Admit: 2019-02-23 | Discharge: 2019-02-23 | Disposition: A | Discharge status: Home / Self Care | Payer: Medicare Other | Attending: Orthopedic Surgery | Admitting: Orthopedic Surgery

## 2019-02-23 ENCOUNTER — Other Ambulatory Visit: Payer: Self-pay

## 2019-02-23 ENCOUNTER — Encounter (HOSPITAL_COMMUNITY)
Admission: RE | Disposition: A | Discharge status: Home / Self Care | Payer: Self-pay | Source: Home / Self Care | Attending: Orthopedic Surgery

## 2019-02-23 ENCOUNTER — Ambulatory Visit (HOSPITAL_COMMUNITY): Payer: Medicare Other

## 2019-02-23 ENCOUNTER — Ambulatory Visit (HOSPITAL_COMMUNITY): Payer: Medicare Other | Admitting: Physician Assistant

## 2019-02-23 ENCOUNTER — Ambulatory Visit (HOSPITAL_COMMUNITY): Payer: Medicare Other | Admitting: Certified Registered Nurse Anesthetist

## 2019-02-23 ENCOUNTER — Encounter (HOSPITAL_COMMUNITY): Payer: Self-pay | Admitting: *Deleted

## 2019-02-23 DIAGNOSIS — Z87891 Personal history of nicotine dependence: Secondary | ICD-10-CM | POA: Insufficient documentation

## 2019-02-23 DIAGNOSIS — Z01810 Encounter for preprocedural cardiovascular examination: Secondary | ICD-10-CM | POA: Insufficient documentation

## 2019-02-23 DIAGNOSIS — M48061 Spinal stenosis, lumbar region without neurogenic claudication: Secondary | ICD-10-CM | POA: Diagnosis not present

## 2019-02-23 DIAGNOSIS — K219 Gastro-esophageal reflux disease without esophagitis: Secondary | ICD-10-CM | POA: Insufficient documentation

## 2019-02-23 DIAGNOSIS — I10 Essential (primary) hypertension: Secondary | ICD-10-CM | POA: Insufficient documentation

## 2019-02-23 DIAGNOSIS — I252 Old myocardial infarction: Secondary | ICD-10-CM | POA: Diagnosis not present

## 2019-02-23 DIAGNOSIS — Z955 Presence of coronary angioplasty implant and graft: Secondary | ICD-10-CM | POA: Insufficient documentation

## 2019-02-23 DIAGNOSIS — M4807 Spinal stenosis, lumbosacral region: Secondary | ICD-10-CM | POA: Diagnosis not present

## 2019-02-23 DIAGNOSIS — E785 Hyperlipidemia, unspecified: Secondary | ICD-10-CM | POA: Insufficient documentation

## 2019-02-23 DIAGNOSIS — J449 Chronic obstructive pulmonary disease, unspecified: Secondary | ICD-10-CM | POA: Diagnosis not present

## 2019-02-23 DIAGNOSIS — Z7982 Long term (current) use of aspirin: Secondary | ICD-10-CM | POA: Insufficient documentation

## 2019-02-23 DIAGNOSIS — Z01812 Encounter for preprocedural laboratory examination: Secondary | ICD-10-CM | POA: Diagnosis not present

## 2019-02-23 DIAGNOSIS — Z8546 Personal history of malignant neoplasm of prostate: Secondary | ICD-10-CM | POA: Diagnosis not present

## 2019-02-23 DIAGNOSIS — M5410 Radiculopathy, site unspecified: Secondary | ICD-10-CM

## 2019-02-23 DIAGNOSIS — Z1159 Encounter for screening for other viral diseases: Secondary | ICD-10-CM | POA: Diagnosis not present

## 2019-02-23 DIAGNOSIS — M5416 Radiculopathy, lumbar region: Secondary | ICD-10-CM | POA: Diagnosis not present

## 2019-02-23 DIAGNOSIS — Z79899 Other long term (current) drug therapy: Secondary | ICD-10-CM | POA: Insufficient documentation

## 2019-02-23 DIAGNOSIS — I2510 Atherosclerotic heart disease of native coronary artery without angina pectoris: Secondary | ICD-10-CM | POA: Diagnosis not present

## 2019-02-23 HISTORY — PX: LUMBAR LAMINECTOMY/DECOMPRESSION MICRODISCECTOMY: SHX5026

## 2019-02-23 SURGERY — LUMBAR LAMINECTOMY/DECOMPRESSION MICRODISCECTOMY
Anesthesia: General | Site: Spine Lumbar | Laterality: Right

## 2019-02-23 MED ORDER — DIAZEPAM 5 MG PO TABS: 5.0000 mg | ORAL_TABLET | Freq: Three times a day (TID) | ORAL | 0 refills | Status: DC | PRN

## 2019-02-23 MED ORDER — METHYLPREDNISOLONE ACETATE 40 MG/ML IJ SUSP
INTRAMUSCULAR | Status: DC | PRN
  Administered 2019-02-23: 40 mg

## 2019-02-23 MED ORDER — HYDROMORPHONE HCL 1 MG/ML IJ SOLN: 0.2500 mg | INTRAMUSCULAR | Status: DC | PRN

## 2019-02-23 MED ORDER — OXYCODONE-ACETAMINOPHEN 5-325 MG PO TABS: 1.0000 | ORAL_TABLET | ORAL | 0 refills | Status: AC | PRN

## 2019-02-23 MED ORDER — PHENYLEPHRINE 40 MCG/ML (10ML) SYRINGE FOR IV PUSH (FOR BLOOD PRESSURE SUPPORT)
PREFILLED_SYRINGE | INTRAVENOUS | Status: AC
  Filled 2019-02-23: qty 10

## 2019-02-23 MED ORDER — POVIDONE-IODINE 7.5 % EX SOLN
Freq: Once | CUTANEOUS | Status: DC
  Filled 2019-02-23: qty 118

## 2019-02-23 MED ORDER — PROPOFOL 10 MG/ML IV BOLUS
INTRAVENOUS | Status: AC
  Filled 2019-02-23: qty 20

## 2019-02-23 MED ORDER — CEFAZOLIN SODIUM-DEXTROSE 2-4 GM/100ML-% IV SOLN
2.0000 g | INTRAVENOUS | Status: AC
  Administered 2019-02-23: 2 g via INTRAVENOUS
  Filled 2019-02-23: qty 100

## 2019-02-23 MED ORDER — 0.9 % SODIUM CHLORIDE (POUR BTL) OPTIME
TOPICAL | Status: DC | PRN
  Administered 2019-02-23: 1000 mL

## 2019-02-23 MED ORDER — PHENYLEPHRINE 40 MCG/ML (10ML) SYRINGE FOR IV PUSH (FOR BLOOD PRESSURE SUPPORT)
PREFILLED_SYRINGE | INTRAVENOUS | Status: DC | PRN
  Administered 2019-02-23 (×2): 80 ug via INTRAVENOUS
  Administered 2019-02-23: 40 ug via INTRAVENOUS

## 2019-02-23 MED ORDER — SODIUM CHLORIDE 0.9 % IV SOLN
INTRAVENOUS | Status: DC | PRN
  Administered 2019-02-23: 20 ug/min via INTRAVENOUS

## 2019-02-23 MED ORDER — FENTANYL CITRATE (PF) 250 MCG/5ML IJ SOLN
INTRAMUSCULAR | Status: DC | PRN
  Administered 2019-02-23: 100 ug via INTRAVENOUS
  Administered 2019-02-23 (×2): 25 ug via INTRAVENOUS
  Administered 2019-02-23 (×2): 50 ug via INTRAVENOUS

## 2019-02-23 MED ORDER — METHYLPREDNISOLONE ACETATE 80 MG/ML IJ SUSP
INTRAMUSCULAR | Status: AC
  Filled 2019-02-23: qty 1

## 2019-02-23 MED ORDER — METHYLENE BLUE 0.5 % INJ SOLN
INTRAVENOUS | Status: AC
  Filled 2019-02-23: qty 10

## 2019-02-23 MED ORDER — METHYLENE BLUE 0.5 % INJ SOLN
INTRAVENOUS | Status: DC | PRN
  Administered 2019-02-23: 1 mL

## 2019-02-23 MED ORDER — HEMOSTATIC AGENTS (NO CHARGE) OPTIME
TOPICAL | Status: DC | PRN
  Administered 2019-02-23: 1 via TOPICAL

## 2019-02-23 MED ORDER — DEXAMETHASONE SODIUM PHOSPHATE 10 MG/ML IJ SOLN
INTRAMUSCULAR | Status: AC
  Filled 2019-02-23: qty 1

## 2019-02-23 MED ORDER — BUPIVACAINE-EPINEPHRINE 0.25% -1:200000 IJ SOLN
INTRAMUSCULAR | Status: DC | PRN
  Administered 2019-02-23: 6 mL

## 2019-02-23 MED ORDER — SUGAMMADEX SODIUM 200 MG/2ML IV SOLN
INTRAVENOUS | Status: DC | PRN
  Administered 2019-02-23: 200 mg via INTRAVENOUS

## 2019-02-23 MED ORDER — PHENYLEPHRINE 40 MCG/ML (10ML) SYRINGE FOR IV PUSH (FOR BLOOD PRESSURE SUPPORT): PREFILLED_SYRINGE | INTRAVENOUS | Status: DC | PRN

## 2019-02-23 MED ORDER — LIDOCAINE 2% (20 MG/ML) 5 ML SYRINGE
INTRAMUSCULAR | Status: DC | PRN
  Administered 2019-02-23: 60 mg via INTRAVENOUS

## 2019-02-23 MED ORDER — ONDANSETRON HCL 4 MG/2ML IJ SOLN
INTRAMUSCULAR | Status: AC
  Filled 2019-02-23: qty 2

## 2019-02-23 MED ORDER — ROCURONIUM BROMIDE 10 MG/ML (PF) SYRINGE
PREFILLED_SYRINGE | INTRAVENOUS | Status: AC
  Filled 2019-02-23: qty 10

## 2019-02-23 MED ORDER — SODIUM CHLORIDE 0.9 % IV SOLN: INTRAVENOUS | Status: DC | PRN

## 2019-02-23 MED ORDER — BUPIVACAINE LIPOSOME 1.3 % IJ SUSP
INTRAMUSCULAR | Status: DC | PRN
  Administered 2019-02-23: 20 mL

## 2019-02-23 MED ORDER — EPHEDRINE SULFATE-NACL 50-0.9 MG/10ML-% IV SOSY
PREFILLED_SYRINGE | INTRAVENOUS | Status: DC | PRN
  Administered 2019-02-23: 10 mg via INTRAVENOUS

## 2019-02-23 MED ORDER — BUPIVACAINE LIPOSOME 1.3 % IJ SUSP
20.0000 mL | Freq: Once | INTRAMUSCULAR | Status: DC
  Filled 2019-02-23: qty 20

## 2019-02-23 MED ORDER — THROMBIN 20000 UNITS EX SOLR
CUTANEOUS | Status: AC
  Filled 2019-02-23: qty 20000

## 2019-02-23 MED ORDER — LIDOCAINE 2% (20 MG/ML) 5 ML SYRINGE
INTRAMUSCULAR | Status: AC
  Filled 2019-02-23: qty 5

## 2019-02-23 MED ORDER — ROCURONIUM BROMIDE 10 MG/ML (PF) SYRINGE
PREFILLED_SYRINGE | INTRAVENOUS | Status: DC | PRN
  Administered 2019-02-23: 10 mg via INTRAVENOUS
  Administered 2019-02-23: 50 mg via INTRAVENOUS

## 2019-02-23 MED ORDER — SUCCINYLCHOLINE CHLORIDE 200 MG/10ML IV SOSY
PREFILLED_SYRINGE | INTRAVENOUS | Status: DC | PRN
  Administered 2019-02-23: 120 mg via INTRAVENOUS

## 2019-02-23 MED ORDER — FENTANYL CITRATE (PF) 250 MCG/5ML IJ SOLN
INTRAMUSCULAR | Status: AC
  Filled 2019-02-23: qty 5

## 2019-02-23 MED ORDER — OXYCODONE HCL 5 MG PO TABS
5.0000 mg | ORAL_TABLET | Freq: Once | ORAL | Status: AC | PRN
  Administered 2019-02-23: 5 mg via ORAL

## 2019-02-23 MED ORDER — SUCCINYLCHOLINE CHLORIDE 200 MG/10ML IV SOSY
PREFILLED_SYRINGE | INTRAVENOUS | Status: AC
  Filled 2019-02-23: qty 10

## 2019-02-23 MED ORDER — THROMBIN 20000 UNITS EX SOLR
CUTANEOUS | Status: DC | PRN
  Administered 2019-02-23: 20 mL via TOPICAL

## 2019-02-23 MED ORDER — LACTATED RINGERS IV SOLN
INTRAVENOUS | Status: DC | PRN
  Administered 2019-02-23 (×2): via INTRAVENOUS

## 2019-02-23 MED ORDER — OXYCODONE HCL 5 MG/5ML PO SOLN: 5.0000 mg | Freq: Once | ORAL | Status: AC | PRN

## 2019-02-23 MED ORDER — OXYCODONE HCL 5 MG PO TABS
ORAL_TABLET | ORAL | Status: AC
  Filled 2019-02-23: qty 1

## 2019-02-23 MED ORDER — ONDANSETRON HCL 4 MG/2ML IJ SOLN
INTRAMUSCULAR | Status: DC | PRN
  Administered 2019-02-23: 4 mg via INTRAVENOUS

## 2019-02-23 MED ORDER — EPHEDRINE 5 MG/ML INJ
INTRAVENOUS | Status: AC
  Filled 2019-02-23: qty 10

## 2019-02-23 MED ORDER — PROPOFOL 10 MG/ML IV BOLUS
INTRAVENOUS | Status: DC | PRN
  Administered 2019-02-23: 30 mg via INTRAVENOUS
  Administered 2019-02-23: 10 mg via INTRAVENOUS
  Administered 2019-02-23: 120 mg via INTRAVENOUS

## 2019-02-23 MED ORDER — PROMETHAZINE HCL 25 MG/ML IJ SOLN: 6.2500 mg | INTRAMUSCULAR | Status: DC | PRN

## 2019-02-23 MED ORDER — BUPIVACAINE-EPINEPHRINE (PF) 0.25% -1:200000 IJ SOLN
INTRAMUSCULAR | Status: AC
  Filled 2019-02-23: qty 30

## 2019-02-23 SURGICAL SUPPLY — 70 items
BENZOIN TINCTURE PRP APPL 2/3 (GAUZE/BANDAGES/DRESSINGS) ×1 IMPLANT
BUR PRECISION FLUTE 5.0 (BURR) ×2 IMPLANT
CABLE BIPOLOR RESECTION CORD (MISCELLANEOUS) ×2 IMPLANT
CANISTER SUCT 3000ML PPV (MISCELLANEOUS) ×2 IMPLANT
CARTRIDGE OIL MAESTRO DRILL (MISCELLANEOUS) ×1 IMPLANT
DIFFUSER DRILL AIR PNEUMATIC (MISCELLANEOUS) ×2 IMPLANT
DRAIN CHANNEL 15F RND FF W/TCR (WOUND CARE) IMPLANT
DRAPE POUCH INSTRU U-SHP 10X18 (DRAPES) ×4 IMPLANT
DRAPE SURG 17X23 STRL (DRAPES) ×8 IMPLANT
DURAPREP 26ML APPLICATOR (WOUND CARE) ×2 IMPLANT
ELECT BLADE 4.0 EZ CLEAN MEGAD (MISCELLANEOUS) ×2
ELECT CAUTERY BLADE 6.4 (BLADE) ×2 IMPLANT
ELECT REM PT RETURN 9FT ADLT (ELECTROSURGICAL) ×2
ELECTRODE BLDE 4.0 EZ CLN MEGD (MISCELLANEOUS) ×1 IMPLANT
ELECTRODE REM PT RTRN 9FT ADLT (ELECTROSURGICAL) ×1 IMPLANT
EVACUATOR SILICONE 100CC (DRAIN) IMPLANT
FILTER STRAW FLUID ASPIR (MISCELLANEOUS) ×2 IMPLANT
GAUZE 4X4 16PLY RFD (DISPOSABLE) ×3 IMPLANT
GAUZE SPONGE 4X4 12PLY STRL (GAUZE/BANDAGES/DRESSINGS) ×2 IMPLANT
GLOVE BIO SURGEON STRL SZ7 (GLOVE) ×3 IMPLANT
GLOVE BIO SURGEON STRL SZ8 (GLOVE) ×2 IMPLANT
GLOVE BIOGEL PI IND STRL 7.0 (GLOVE) ×1 IMPLANT
GLOVE BIOGEL PI IND STRL 8 (GLOVE) ×1 IMPLANT
GLOVE BIOGEL PI INDICATOR 7.0 (GLOVE) ×1
GLOVE BIOGEL PI INDICATOR 8 (GLOVE) ×4
GLOVE ECLIPSE 7.5 STRL STRAW (GLOVE) ×3 IMPLANT
GOWN STRL REUS W/ TWL LRG LVL3 (GOWN DISPOSABLE) ×1 IMPLANT
GOWN STRL REUS W/ TWL XL LVL3 (GOWN DISPOSABLE) ×2 IMPLANT
GOWN STRL REUS W/TWL 2XL LVL3 (GOWN DISPOSABLE) ×1 IMPLANT
GOWN STRL REUS W/TWL LRG LVL3 (GOWN DISPOSABLE) ×1
GOWN STRL REUS W/TWL XL LVL3 (GOWN DISPOSABLE) ×2
IV CATH 14GX2 1/4 (CATHETERS) ×2 IMPLANT
KIT BASIN OR (CUSTOM PROCEDURE TRAY) ×2 IMPLANT
KIT POSITION SURG JACKSON T1 (MISCELLANEOUS) ×2 IMPLANT
KIT TURNOVER KIT B (KITS) ×2 IMPLANT
NDL 18GX1X1/2 (RX/OR ONLY) (NEEDLE) ×1 IMPLANT
NDL HYPO 25GX1X1/2 BEV (NEEDLE) ×1 IMPLANT
NDL SPNL 18GX3.5 QUINCKE PK (NEEDLE) ×2 IMPLANT
NEEDLE 18GX1X1/2 (RX/OR ONLY) (NEEDLE) ×2 IMPLANT
NEEDLE 22X1 1/2 (OR ONLY) (NEEDLE) ×2 IMPLANT
NEEDLE HYPO 25GX1X1/2 BEV (NEEDLE) ×2 IMPLANT
NEEDLE SPNL 18GX3.5 QUINCKE PK (NEEDLE) ×4 IMPLANT
NS IRRIG 1000ML POUR BTL (IV SOLUTION) ×2 IMPLANT
OIL CARTRIDGE MAESTRO DRILL (MISCELLANEOUS) ×2
PACK LAMINECTOMY ORTHO (CUSTOM PROCEDURE TRAY) ×2 IMPLANT
PACK UNIVERSAL I (CUSTOM PROCEDURE TRAY) ×2 IMPLANT
PAD ARMBOARD 7.5X6 YLW CONV (MISCELLANEOUS) ×4 IMPLANT
PATTIES SURGICAL .5 X.5 (GAUZE/BANDAGES/DRESSINGS) IMPLANT
PATTIES SURGICAL .5 X1 (DISPOSABLE) ×2 IMPLANT
SPONGE INTESTINAL PEANUT (DISPOSABLE) ×2 IMPLANT
SPONGE SURGIFOAM ABS GEL 100 (HEMOSTASIS) ×2 IMPLANT
STRIP CLOSURE SKIN 1/2X4 (GAUZE/BANDAGES/DRESSINGS) ×1 IMPLANT
SURGIFLO W/THROMBIN 8M KIT (HEMOSTASIS) ×1 IMPLANT
SUT MNCRL AB 4-0 PS2 18 (SUTURE) ×2 IMPLANT
SUT VIC AB 0 CT1 18XCR BRD 8 (SUTURE) IMPLANT
SUT VIC AB 0 CT1 27 (SUTURE)
SUT VIC AB 0 CT1 27XBRD ANBCTR (SUTURE) IMPLANT
SUT VIC AB 0 CT1 8-18 (SUTURE)
SUT VIC AB 1 CT1 18XCR BRD 8 (SUTURE) ×1 IMPLANT
SUT VIC AB 1 CT1 8-18 (SUTURE) ×1
SUT VIC AB 2-0 CT2 18 VCP726D (SUTURE) ×2 IMPLANT
SYR 20CC LL (SYRINGE) ×2 IMPLANT
SYR BULB IRRIGATION 50ML (SYRINGE) ×2 IMPLANT
SYR CONTROL 10ML LL (SYRINGE) ×4 IMPLANT
SYR TB 1ML 26GX3/8 SAFETY (SYRINGE) ×4 IMPLANT
SYR TB 1ML LUER SLIP (SYRINGE) ×4 IMPLANT
TAPE CLOTH SURG 4X10 WHT LF (GAUZE/BANDAGES/DRESSINGS) ×1 IMPLANT
TOWEL GREEN STERILE (TOWEL DISPOSABLE) ×2 IMPLANT
TOWEL GREEN STERILE FF (TOWEL DISPOSABLE) ×2 IMPLANT
YANKAUER SUCT BULB TIP NO VENT (SUCTIONS) ×2 IMPLANT

## 2019-02-23 NOTE — Anesthesia Procedure Notes (Addendum)
Procedure Name: Intubation Date/Time: 02/23/2019 7:42 AM Performed by: Harden Mo, CRNA Pre-anesthesia Checklist: Patient identified, Emergency Drugs available, Suction available and Patient being monitored Patient Re-evaluated:Patient Re-evaluated prior to induction Oxygen Delivery Method: Circle System Utilized Preoxygenation: Pre-oxygenation with 100% oxygen Induction Type: IV induction and Rapid sequence Laryngoscope Size: Miller and 2 Grade View: Grade I Tube type: Oral Tube size: 7.5 mm Number of attempts: 1 Airway Equipment and Method: Stylet and Oral airway Placement Confirmation: ETT inserted through vocal cords under direct vision,  positive ETCO2 and breath sounds checked- equal and bilateral Secured at: 23 cm Tube secured with: Tape Dental Injury: Teeth and Oropharynx as per pre-operative assessment

## 2019-02-23 NOTE — Op Note (Signed)
PATIENT NAME: Jay Wilkins RECORD NO.:   [id number]    PHYSICIAN:  Phylliss Bob, MD      DATE OF BIRTH: 08-Jan-1941   DATE OF PROCEDURE: 02/23/2019                               OPERATIVE REPORT   PREOPERATIVE DIAGNOSES: 1. Right leg pain secondary to right L5 radiculopathy 2. Severe right neuroforaminal stenosis, L5/S1  POSTOPERATIVE DIAGNOSES: 1. Right leg pain secondary to right L5 radiculopathy 2. Severe right neuroforaminal stenosis, L5/S1  PROCEDURE:  L5/S1 decompression, including laminectomy with bilateral partial facetectomy and thorough right-sided foraminotomy with decompression of exiting right L5 nerve  SURGEON:  Phylliss Bob, MD.  ASSISTANTPricilla Holm, PA-C.  ANESTHESIA:  General endotracheal anesthesia.  COMPLICATIONS:  None.  DISPOSITION:  Stable.  ESTIMATED BLOOD LOSS:  Minimal.  INDICATIONS FOR SURGERY:  Briefly, Mr. Overturf is a very pleasant 78- year-old patient, who did present to me with pain in his right leg. The patient's MRI did reveal spinal stenosis at L4-5.  We did proceed with appropriate conservative treatment, but the patient did continue to have ongoing pain, which he did feel was limiting his function substantially.  Given the patient's ongoing pain and dysfunction, we did discuss proceeding with the procedure reflected above.  The patient was fully aware of the risks and limitations of surgery and did wish to proceed.  OPERATIVE DETAILS:  On 02/23/2019, the patient was brought to surgery and general endotracheal anesthesia was administered.  The patient was placed prone on a well-padded flat Jackson bed with a spinal frame. Antibiotics were given and the back was prepped and draped in the usual sterile fashion.  A time-out procedure was performed.  I then made a midline incision overlying the L5?s1 intervertebral space.  The fascia was incised at the midline.  The paraspinal musculature was bluntly retracted laterally and  held retracted with a self-retaining retractor. After confirming the appropriate operative level, I did remove the spinous process of L5.  At this point, I proceeded with a partial facetectomy on the right and left sides. I then turned my attention to the right. A liberal partial facetectomy was continued on the right.  I did continue this to the medial border of the L5 and S1 pedicles.  I was able to identify the exiting right L5 nerve.  Using a [facility], I was able to determine that there was substantial neuroforaminal narrowing.  This was very thoroughly addressed, performing a very thorough neuroforaminal decompression.  At the termination of the decompression, I was able to follow the right L5 nerve well lateral to the L5 and S1 pedicles with a [facility], with no compression of the nerve noted.  I was very pleased with the decompression.  Bleeding was then controlled using FloSeal.  Gelfoam was then placed over the laminotomy defect.At this point, 20 mg of Depo-Medrol was introduced about the epidural space.  Prior to this, the wound was copiously irrigated with a total of approximately 2 L of normal saline. There was no extravasation of cerebrospinal fluid noted throughout the entire surgery.  At this point, the wound was closed in layers using #1 Vicryl, followed by 2-0 Vicryl, followed by 4- 0 Monocryl.  Benzoin and Steri-Strips were applied, followed by a sterile dressing.  All instrument counts were correct at the termination of the procedure.  Of note, Pricilla Holm, PA-C,  was my assistant throughout surgery, and did aid in retraction, suctioning, and closure from start to finish.    Phylliss Bob, MD

## 2019-02-23 NOTE — Anesthesia Postprocedure Evaluation (Signed)
Anesthesia Post Note  Patient: Jay Wilkins  Procedure(s) Performed: LUMBAR FIVE - SACRUM ONE DECOMPRESSION (Right Spine Lumbar)     Patient location during evaluation: PACU Anesthesia Type: General Level of consciousness: awake and alert Pain management: pain level controlled Vital Signs Assessment: post-procedure vital signs reviewed and stable Respiratory status: spontaneous breathing, nonlabored ventilation and respiratory function stable Cardiovascular status: blood pressure returned to baseline and stable Postop Assessment: no apparent nausea or vomiting Anesthetic complications: no    Last Vitals:  Vitals:   02/23/19 1005 02/23/19 1016  BP: 135/72 132/70  Pulse: 68 69  Resp: 10 14  Temp:    SpO2: 100% 100%    Last Pain:  Vitals:   02/23/19 1010  TempSrc:   PainSc: [place]
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
## 2019-02-23 NOTE — Transfer of Care (Signed)
Immediate Anesthesia Transfer of Care Note  Patient: Jay Wilkins  Procedure(s) Performed: LUMBAR FIVE - SACRUM ONE DECOMPRESSION (Right Spine Lumbar)  Patient Location: PACU  Anesthesia Type:General  Level of Consciousness: awake, alert  and oriented  Airway & Oxygen Therapy: Patient Spontanous Breathing  Post-op Assessment: Report given to RN, Post -op Vital signs reviewed and stable and Patient moving all extremities X 4  Post vital signs: Reviewed and stable  Last Vitals:  Vitals Value Taken Time  BP 166/83 02/23/19 0946  Temp    Pulse 77 02/23/19 0946  Resp 25 02/23/19 0946  SpO2 100 % 02/23/19 0946  Vitals shown include unvalidated device data.  Last Pain:  Vitals:   02/23/19 0621  TempSrc:   PainSc: 2          Complications: No apparent anesthesia complications

## 2019-02-24 ENCOUNTER — Encounter (HOSPITAL_COMMUNITY): Payer: Self-pay | Admitting: Orthopedic Surgery

## 2019-03-08 DIAGNOSIS — Z9889 Other specified postprocedural states: Secondary | ICD-10-CM | POA: Diagnosis not present

## 2019-04-06 ENCOUNTER — Other Ambulatory Visit: Payer: Self-pay

## 2019-04-06 ENCOUNTER — Telehealth: Payer: Self-pay

## 2019-04-06 ENCOUNTER — Other Ambulatory Visit: Payer: Medicare Other | Admitting: *Deleted

## 2019-04-06 DIAGNOSIS — D721 Eosinophilia, unspecified: Secondary | ICD-10-CM

## 2019-04-06 DIAGNOSIS — I2510 Atherosclerotic heart disease of native coronary artery without angina pectoris: Secondary | ICD-10-CM | POA: Diagnosis not present

## 2019-04-06 DIAGNOSIS — Z9861 Coronary angioplasty status: Secondary | ICD-10-CM | POA: Diagnosis not present

## 2019-04-06 DIAGNOSIS — I10 Essential (primary) hypertension: Secondary | ICD-10-CM | POA: Diagnosis not present

## 2019-04-06 NOTE — Telephone Encounter (Signed)
Medications reviewed and appt confirmed. Pt has given verbal consent for video visit. Pt's most recent vitals are in recent appts for surgery. Pt also states his weight is 170lb.    YOUR CARDIOLOGY TEAM HAS ARRANGED FOR AN E-VISIT FOR YOUR APPOINTMENT - PLEASE REVIEW IMPORTANT INFORMATION BELOW SEVERAL DAYS PRIOR TO YOUR APPOINTMENT  Due to the recent COVID-19 pandemic, we are transitioning in-person office visits to tele-medicine visits in an effort to decrease unnecessary exposure to our patients, their families, and staff. These visits are billed to your insurance just like a normal visit is. We also encourage you to sign up for MyChart if you have not already done so. You will need a smartphone if possible. For patients that do not have this, we can still complete the visit using a regular telephone but do prefer a smartphone to enable video when possible. You may have a family member that lives with you that can help. If possible, we also ask that you have a blood pressure cuff and scale at home to measure your blood pressure, heart rate and weight prior to your scheduled appointment. Patients with clinical needs that need an in-person evaluation and testing will still be able to come to the office if absolutely necessary. If you have any questions, feel free to call our office.     YOUR PROVIDER WILL BE USING THE FOLLOWING PLATFORM TO COMPLETE YOUR VISIT:[email or web address] . IF USING MYCHART - How to Download the MyChart App to Your SmartPhone   - If Apple, go to [employer] and type in MyChart in the search bar and download the app. If Android, ask patient to go to [employer] and type in [place] in the search bar and download the app. The app is free but as with any other app downloads, your phone may require you to verify saved payment information or Apple/Android password.  - You will need to then log into the app with your MyChart username and password, and select [facility] as your healthcare  provider to link the account.  - When it is time for your visit, go to the MyChart app, find appointments, and click Begin Video Visit. Be sure to Select Allow for your device to access the Microphone and Camera for your visit. You will then be connected, and your provider will be with you shortly.  **If you have any issues connecting or need assistance, please contact MyChart service desk [phone])**  **If using a computer, in order to ensure the best quality for your visit, you will need to use either of the following Internet Browsers: Insurance underwriter or [employer]**  . IF USING DOXIMITY or [email or web address].ME - The staff will give you instructions on receiving your link to join the meeting the day of your visit.      2-3 DAYS BEFORE YOUR APPOINTMENT  You will receive a telephone call from one of our [place] team members - your caller ID may say "Unknown caller." If this is a video visit, we will walk you through how to get the video launched on your phone. We will remind you check your blood pressure, heart rate and weight prior to your scheduled appointment. If you have an Apple Watch or Kardia, please upload any pertinent ECG strips the day before or morning of your appointment to [facility]. Our staff will also make sure you have reviewed the consent and agree to move forward with your scheduled tele-health visit.     THE DAY OF  YOUR APPOINTMENT  Approximately 15 minutes prior to your scheduled appointment, you will receive a telephone call from one of [facility] team - your caller ID may say "Unknown caller."  Our staff will confirm medications, vital signs for the day and any symptoms you may be experiencing. Please have this information available prior to the time of visit start. It may also be helpful for you to have a pad of paper and pen handy for any instructions given during your visit. They will also walk you through joining the smartphone meeting if this is a video  visit.    CONSENT FOR TELE-HEALTH VISIT - PLEASE REVIEW  I hereby voluntarily request, consent and authorize [facility] and its employed or contracted physicians, physician assistants, nurse practitioners or other licensed health care professionals (the Practitioner), to provide me with telemedicine health care services (the "Services") as deemed necessary by the treating Practitioner. I acknowledge and consent to receive the Services by the Practitioner via telemedicine. I understand that the telemedicine visit will involve communicating with the Practitioner through live audiovisual communication technology and the disclosure of certain medical information by electronic transmission. I acknowledge that I have been given the opportunity to request an in-person assessment or other available alternative prior to the telemedicine visit and am voluntarily participating in the telemedicine visit.  I understand that I have the right to withhold or withdraw my consent to the use of telemedicine in the course of my care at any time, without affecting my right to future care or treatment, and that the Practitioner or I may terminate the telemedicine visit at any time. I understand that I have the right to inspect all information obtained and/or recorded in the course of the telemedicine visit and may receive copies of available information for a reasonable fee.  I understand that some of the potential risks of receiving the Services via telemedicine include:  Marland Kitchen Delay or interruption in medical evaluation due to technological equipment failure or disruption; . Information transmitted may not be sufficient (e.g. poor resolution of images) to allow for appropriate medical decision making by the Practitioner; and/or  . In rare instances, security protocols could fail, causing a breach of personal health information.  Furthermore, I acknowledge that it is my responsibility to provide information about my medical  history, conditions and care that is complete and accurate to the best of my ability. I acknowledge that Practitioner's advice, recommendations, and/or decision may be based on factors not within their control, such as incomplete or inaccurate data provided by me or distortions of diagnostic images or specimens that may result from electronic transmissions. I understand that the practice of medicine is not an exact science and that Practitioner makes no warranties or guarantees regarding treatment outcomes. I acknowledge that I will receive a copy of this consent concurrently upon execution via email to the email address I last provided but may also request a printed copy by calling the office of [facility].    I understand that my insurance will be billed for this visit.   I have read or had this consent read to me. . I understand the contents of this consent, which adequately explains the benefits and risks of the Services being provided via telemedicine.  . I have been provided ample opportunity to ask questions regarding this consent and the Services and have had my questions answered to my satisfaction. . I give my informed consent for the services to be provided through the use of telemedicine  in my medical care  By participating in this telemedicine visit I agree to the above.

## 2019-04-07 LAB — CBC WITH DIFFERENTIAL/PLATELET
Basophils Absolute: 0.1 10*3/uL (ref 0.0–0.2)
Basos: 0 %
EOS (ABSOLUTE): 6.8 10*3/uL — ABNORMAL HIGH (ref 0.0–0.4)
Eos: 43 %
Hematocrit: 46.1 % (ref 37.5–51.0)
Hemoglobin: 15.3 g/dL (ref 13.0–17.7)
Immature Grans (Abs): 0 10*3/uL (ref 0.0–0.1)
Immature Granulocytes: 0 %
Lymphocytes Absolute: 2.2 10*3/uL (ref 0.7–3.1)
Lymphs: 14 %
MCH: 33.6 pg — ABNORMAL HIGH (ref 26.6–33.0)
MCHC: 33.2 g/dL (ref 31.5–35.7)
MCV: 101 fL — ABNORMAL HIGH (ref 79–97)
Monocytes Absolute: 1.1 10*3/uL — ABNORMAL HIGH (ref 0.1–0.9)
Monocytes: 7 %
Neutrophils Absolute: 5.6 10*3/uL (ref 1.4–7.0)
Neutrophils: 36 %
Platelets: 257 10*3/uL (ref 150–450)
RBC: 4.56 x10E6/uL (ref 4.14–5.80)
RDW: 12.6 % (ref 11.6–15.4)
WBC: 15.7 10*3/uL — ABNORMAL HIGH (ref 3.4–10.8)

## 2019-04-07 LAB — BASIC METABOLIC PANEL
BUN/Creatinine Ratio: 24 (ref 10–24)
BUN: 22 mg/dL (ref 8–27)
CO2: 24 mmol/L (ref 20–29)
Calcium: 9.2 mg/dL (ref 8.6–10.2)
Chloride: 96 mmol/L (ref 96–106)
Creatinine, Ser: 0.91 mg/dL (ref 0.76–1.27)
GFR calc Af Amer: 94 mL/min/{1.73_m2} (ref >59)
GFR calc non Af Amer: 81 mL/min/{1.73_m2} (ref >59)
Glucose: 102 mg/dL — ABNORMAL HIGH (ref 65–99)
Potassium: 4.5 mmol/L (ref 3.5–5.2)
Sodium: 135 mmol/L (ref 134–144)

## 2019-04-07 LAB — HEPATIC FUNCTION PANEL
ALT: 13 IU/L (ref 0–44)
AST: 20 IU/L (ref 0–40)
Albumin: 4.4 g/dL (ref 3.7–4.7)
Alkaline Phosphatase: 66 IU/L (ref 39–117)
Bilirubin Total: 0.9 mg/dL (ref 0.0–1.2)
Bilirubin, Direct: 0.22 mg/dL (ref 0.00–0.40)
Total Protein: 6.6 g/dL (ref 6.0–8.5)

## 2019-04-07 LAB — LIPID PANEL
Chol/HDL Ratio: 2.1 ratio (ref 0.0–5.0)
Cholesterol, Total: 165 mg/dL (ref 100–199)
HDL: 77 mg/dL (ref >39)
LDL Calculated: 74 mg/dL (ref 0–99)
Triglycerides: 71 mg/dL (ref 0–149)
VLDL Cholesterol Cal: 14 mg/dL (ref 5–40)
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
## 2019-04-10 ENCOUNTER — Telehealth (INDEPENDENT_AMBULATORY_CARE_PROVIDER_SITE_OTHER): Payer: Medicare Other | Admitting: Cardiovascular Disease

## 2019-04-10 ENCOUNTER — Other Ambulatory Visit: Payer: Self-pay

## 2019-04-10 VITALS — BP 130/70 | HR 70 | Ht 67.0 in | Wt 170.0 lb

## 2019-04-10 DIAGNOSIS — Z9861 Coronary angioplasty status: Secondary | ICD-10-CM | POA: Diagnosis not present

## 2019-04-10 DIAGNOSIS — E785 Hyperlipidemia, unspecified: Secondary | ICD-10-CM

## 2019-04-10 DIAGNOSIS — I10 Essential (primary) hypertension: Secondary | ICD-10-CM

## 2019-04-10 DIAGNOSIS — I2510 Atherosclerotic heart disease of native coronary artery without angina pectoris: Secondary | ICD-10-CM

## 2019-04-10 NOTE — Patient Instructions (Addendum)
Medication Instructions:  Your physician recommends that you continue on your current medications as directed. Please refer to the Current Medication list given to you today.  If you need a refill on your cardiac medications before your next appointment, please call your pharmacy.     Lab work: Your physician recommends that you return for lab work in: 12 months on the day of or a few days before your office visit with Dr. Acie Fredrickson.  You will need to FAST for this appointment - nothing to eat or drink after midnight the night before except water.     Testing/Procedures: None Ordered   Follow-Up: At [facility], you and your health needs are our priority.  As part of our continuing mission to provide you with exceptional heart care, we have created designated Provider Care Teams.  These Care Teams include your primary Cardiologist (physician) and Advanced Practice Providers (APPs -  Physician Assistants and Nurse Practitioners) who all work together to provide you with the care you need, when you need it. You will need a follow up appointment in:  1 years.  Please call our office 2 months in advance to schedule this appointment.  You may see Dr. Acie Fredrickson or one of the following Advanced Practice Providers on your designated Care Team: Richardson Dopp, PA-C [place], [place] . Daune Perch, NP

## 2019-05-03 DIAGNOSIS — H40059 Ocular hypertension, unspecified eye: Secondary | ICD-10-CM | POA: Diagnosis not present

## 2019-05-03 DIAGNOSIS — Z8546 Personal history of malignant neoplasm of prostate: Secondary | ICD-10-CM | POA: Diagnosis not present

## 2019-05-03 DIAGNOSIS — I2510 Atherosclerotic heart disease of native coronary artery without angina pectoris: Secondary | ICD-10-CM | POA: Diagnosis not present

## 2019-05-03 DIAGNOSIS — E7800 Pure hypercholesterolemia, unspecified: Secondary | ICD-10-CM | POA: Diagnosis not present

## 2019-05-03 DIAGNOSIS — I252 Old myocardial infarction: Secondary | ICD-10-CM | POA: Diagnosis not present

## 2019-05-03 DIAGNOSIS — J449 Chronic obstructive pulmonary disease, unspecified: Secondary | ICD-10-CM | POA: Diagnosis not present

## 2019-05-04 DIAGNOSIS — H34831 Tributary (branch) retinal vein occlusion, right eye, with macular edema: Secondary | ICD-10-CM | POA: Diagnosis not present

## 2019-05-21 DIAGNOSIS — Z23 Encounter for immunization: Secondary | ICD-10-CM | POA: Diagnosis not present

## 2019-05-30 DIAGNOSIS — D225 Melanocytic nevi of trunk: Secondary | ICD-10-CM | POA: Diagnosis not present

## 2019-05-30 DIAGNOSIS — L570 Actinic keratosis: Secondary | ICD-10-CM | POA: Diagnosis not present

## 2019-05-30 DIAGNOSIS — C44519 Basal cell carcinoma of skin of other part of trunk: Secondary | ICD-10-CM | POA: Diagnosis not present

## 2019-05-30 DIAGNOSIS — I788 Other diseases of capillaries: Secondary | ICD-10-CM | POA: Diagnosis not present

## 2019-05-30 DIAGNOSIS — L821 Other seborrheic keratosis: Secondary | ICD-10-CM | POA: Diagnosis not present

## 2019-05-30 DIAGNOSIS — D485 Neoplasm of uncertain behavior of skin: Secondary | ICD-10-CM | POA: Diagnosis not present

## 2019-05-30 DIAGNOSIS — L812 Freckles: Secondary | ICD-10-CM | POA: Diagnosis not present

## 2019-05-30 DIAGNOSIS — Z85828 Personal history of other malignant neoplasm of skin: Secondary | ICD-10-CM | POA: Diagnosis not present

## 2019-06-02 DIAGNOSIS — H43813 Vitreous degeneration, bilateral: Secondary | ICD-10-CM | POA: Diagnosis not present

## 2019-06-02 DIAGNOSIS — H35373 Puckering of macula, bilateral: Secondary | ICD-10-CM | POA: Diagnosis not present

## 2019-06-02 DIAGNOSIS — H3581 Retinal edema: Secondary | ICD-10-CM | POA: Diagnosis not present

## 2019-06-02 DIAGNOSIS — H348322 Tributary (branch) retinal vein occlusion, left eye, stable: Secondary | ICD-10-CM | POA: Diagnosis not present

## 2019-06-05 ENCOUNTER — Ambulatory Visit: Payer: Medicare Other | Admitting: Emergency Medicine

## 2019-06-12 ENCOUNTER — Encounter: Payer: Self-pay | Admitting: Emergency Medicine

## 2019-06-12 ENCOUNTER — Ambulatory Visit (INDEPENDENT_AMBULATORY_CARE_PROVIDER_SITE_OTHER): Payer: Medicare Other | Admitting: Emergency Medicine

## 2019-06-12 ENCOUNTER — Other Ambulatory Visit: Payer: Self-pay

## 2019-06-12 DIAGNOSIS — J438 Other emphysema: Secondary | ICD-10-CM | POA: Diagnosis not present

## 2019-06-12 DIAGNOSIS — Z9861 Coronary angioplasty status: Secondary | ICD-10-CM

## 2019-06-12 DIAGNOSIS — I2510 Atherosclerotic heart disease of native coronary artery without angina pectoris: Secondary | ICD-10-CM | POA: Diagnosis not present

## 2019-06-12 MED ORDER — ALBUTEROL SULFATE HFA 108 (90 BASE) MCG/ACT IN AERS: 2.0000 | INHALATION_SPRAY | RESPIRATORY_TRACT | 11 refills | Status: DC | PRN

## 2019-06-12 NOTE — Progress Notes (Signed)
Subjective:    Patient ID: Jay Wilkins, male    DOB: May 29, 1941, 78 y.o.   MRN: [id number]    HPI  ROV 04/28/18 --78 year old man who follows up today for severe obstructive disease, hx tobacco (55 pk-yrs).  He is also had cough in association with a right middle lobe pneumonia at the end of 2018, sustained by some allergic rhinitis (flonase), GERD (on Nexium).  Following resolution of the pneumonia he did not have much in the way of symptoms and for this reason I did not start him on a schedule bronchodilator.  I did ask you to use albuterol judiciously to see if he would get benefit. He is active, no limitations on his breathing except with stairs.  He has had PNA vaccines with Dr Laurann Montana. Is due for flu shot next month there.   He saw allergy > no significant allergies, mild to grass.   ROV 06/12/2019 --follow-up visit for 79 year old man with a history of tobacco, severe obstructive lung disease, allergic rhinitis and GERD. Also has a hx of chronically elevated eosinophils >> negative BM bx in 2017, reassuring molecular testing. Currently managed on albuterol as needed.  He has been using very rarely . Little change in his breathing and functional capacity since last visit. He is using an exercise bike at home.  He is on Flonase, Nexium as needed Since last visit he underwent back surgery, uncomplicated.    Review of Systems  Constitutional: Negative for fever and unexpected weight change.  HENT: Positive for sore throat. Negative for congestion, dental problem, ear pain, nosebleeds, postnasal drip, rhinorrhea, sinus pressure, sneezing and trouble swallowing.   Eyes: Negative for redness and itching.  Respiratory: Positive for cough. Negative for chest tightness, shortness of breath and wheezing.   Cardiovascular: Negative for palpitations and leg swelling.  Gastrointestinal: Negative for nausea and vomiting.  Genitourinary: Negative for dysuria.  Musculoskeletal: Negative for joint  swelling.  Skin: Negative for rash.  Neurological: Negative for headaches.  Hematological: Does not bruise/bleed easily.  Psychiatric/Behavioral: Negative for dysphoric mood. The patient is not nervous/anxious.        Objective:   Physical Exam Vitals:   06/12/19 1547  BP: 120/76  Pulse: 66  SpO2: 97%  Weight: 169 lb (76.7 kg)  Height: 5\' 7"  (1.702 m)   Gen: Pleasant, well-nourished, in no distress,  normal affect  ENT: No lesions,  mouth clear,  oropharynx clear w some mild erythema, no postnasal drip  Neck: No JVD, no stridor  Lungs: No use of accessory muscles, clear without rales or rhonchi  Cardiovascular: RRR, heart sounds normal, no murmur or gallops, no peripheral edema  Musculoskeletal: No deformities, no cyanosis or clubbing  Neuro: alert, non focal  Skin: Warm, no lesions or rashes     Assessment & Plan:  COPD with emphysema (HCC) Significant obstructive lung disease by physiology but he has minimal symptoms, almost never requires albuterol, has a good exercise tolerance, does not desaturate.  No exacerbations.  At this time I think we can continue to treat him with albuterol only as needed.  Flu shot is up-to-date.  Keep albuterol available to use 2 puffs up to every 4 hours if needed for shortness of breath, chest tightness, wheezing.  Continue other medications as you have been taking them. Flu shot up-to-date Follow with Dr. Lamonte Sakai in 12 months or sooner if you have any problems.   Baltazar Apo, MD, PhD 06/12/2019, 4:12 PM [facility] [phone]  or if no answer [phone]

## 2019-06-12 NOTE — Assessment & Plan Note (Signed)
Significant obstructive lung disease by physiology but he has minimal symptoms, almost never requires albuterol, has a good exercise tolerance, does not desaturate.  No exacerbations.  At this time I think we can continue to treat him with albuterol only as needed.  Flu shot is up-to-date.  Keep albuterol available to use 2 puffs up to every 4 hours if needed for shortness of breath, chest tightness, wheezing.  Continue other medications as you have been taking them. Flu shot up-to-date Follow with Dr. Lamonte Sakai in 12 months or sooner if you have any problems.

## 2019-06-12 NOTE — Addendum Note (Signed)
Addended by: Desmond Dike C on: 06/12/2019 04:13 PM   Modules accepted: Orders

## 2019-06-12 NOTE — Patient Instructions (Addendum)
Keep albuterol available to use 2 puffs up to every 4 hours if needed for shortness of breath, chest tightness, wheezing.  Continue other medications as you have been taking them. Flu shot up-to-date Follow with Dr. Lamonte Sakai in 12 months or sooner if you have any problems.

## 2019-06-13 DIAGNOSIS — Z0000 Encounter for general adult medical examination without abnormal findings: Secondary | ICD-10-CM | POA: Diagnosis not present

## 2019-06-13 DIAGNOSIS — I2510 Atherosclerotic heart disease of native coronary artery without angina pectoris: Secondary | ICD-10-CM | POA: Diagnosis not present

## 2019-06-13 DIAGNOSIS — D721 Eosinophilia, unspecified: Secondary | ICD-10-CM | POA: Diagnosis not present

## 2019-06-13 DIAGNOSIS — Z8546 Personal history of malignant neoplasm of prostate: Secondary | ICD-10-CM | POA: Diagnosis not present

## 2019-06-13 DIAGNOSIS — K219 Gastro-esophageal reflux disease without esophagitis: Secondary | ICD-10-CM | POA: Diagnosis not present

## 2019-06-13 DIAGNOSIS — Z23 Encounter for immunization: Secondary | ICD-10-CM | POA: Diagnosis not present

## 2019-06-13 DIAGNOSIS — Z1389 Encounter for screening for other disorder: Secondary | ICD-10-CM | POA: Diagnosis not present

## 2019-06-13 DIAGNOSIS — J449 Chronic obstructive pulmonary disease, unspecified: Secondary | ICD-10-CM | POA: Diagnosis not present

## 2019-06-29 DIAGNOSIS — H34832 Tributary (branch) retinal vein occlusion, left eye, with macular edema: Secondary | ICD-10-CM | POA: Diagnosis not present

## 2019-06-29 DIAGNOSIS — H3581 Retinal edema: Secondary | ICD-10-CM | POA: Diagnosis not present

## 2019-06-29 DIAGNOSIS — H35372 Puckering of macula, left eye: Secondary | ICD-10-CM | POA: Diagnosis not present

## 2019-06-30 DIAGNOSIS — H348322 Tributary (branch) retinal vein occlusion, left eye, stable: Secondary | ICD-10-CM | POA: Diagnosis not present

## 2019-06-30 DIAGNOSIS — H35372 Puckering of macula, left eye: Secondary | ICD-10-CM | POA: Diagnosis not present

## 2019-07-07 DIAGNOSIS — H3581 Retinal edema: Secondary | ICD-10-CM | POA: Diagnosis not present

## 2019-07-07 DIAGNOSIS — H35372 Puckering of macula, left eye: Secondary | ICD-10-CM | POA: Diagnosis not present

## 2019-07-14 DIAGNOSIS — M542 Cervicalgia: Secondary | ICD-10-CM | POA: Diagnosis not present

## 2019-07-28 DIAGNOSIS — H35373 Puckering of macula, bilateral: Secondary | ICD-10-CM | POA: Diagnosis not present

## 2019-07-28 DIAGNOSIS — H3581 Retinal edema: Secondary | ICD-10-CM | POA: Diagnosis not present

## 2019-09-03 ENCOUNTER — Ambulatory Visit: Payer: Medicare Other | Attending: Internal Medicine

## 2019-09-03 DIAGNOSIS — Z23 Encounter for immunization: Secondary | ICD-10-CM | POA: Insufficient documentation

## 2019-09-03 NOTE — Progress Notes (Signed)
   Covid-19 Vaccination Clinic  Name:  Jay Wilkins    MRN: [id number] DOB: [birth year]  09/03/2019  [name] was observed post Covid-19 immunization for 30 minutes based on pre-vaccination screening without incidence. He was provided with Vaccine Information Sheet and instruction to access the V-Safe system.   [name] was instructed to call 911 with any severe reactions post vaccine: Marland Kitchen Difficulty breathing  . Swelling of your face and throat  . A fast heartbeat  . A bad rash all over your body  . Dizziness and weakness    Immunizations Administered    Name Date Dose VIS Date Route   Pfizer COVID-19 Vaccine 09/03/2019 12:28 PM 0.3 mL 08/04/2019 Intramuscular   Manufacturer: [employer], [employer]   Lot: [id number]   [place]: [id number]

## 2019-09-05 ENCOUNTER — Telehealth: Payer: Self-pay | Admitting: Hematology and Oncology

## 2019-09-05 NOTE — Telephone Encounter (Signed)
Returned patient's phone call regarding cancelling 01/19 appointment, per patient's request appointment has been cancelled.

## 2019-09-12 ENCOUNTER — Ambulatory Visit: Payer: Medicare Other | Admitting: Hematology and Oncology

## 2019-09-24 ENCOUNTER — Ambulatory Visit: Payer: Medicare Other | Attending: Internal Medicine

## 2019-09-24 DIAGNOSIS — Z23 Encounter for immunization: Secondary | ICD-10-CM | POA: Insufficient documentation

## 2019-09-24 NOTE — Progress Notes (Signed)
   Covid-19 Vaccination Clinic  Name:  Jay Wilkins    MRN: [id number] DOB: February 21, 1941  09/24/2019  [name] was observed post Covid-19 immunization for 15 minutes without incidence. He was provided with Vaccine Information Sheet and instruction to access the V-Safe system.   [name] was instructed to call 911 with any severe reactions post vaccine: Marland Kitchen Difficulty breathing  . Swelling of your face and throat  . A fast heartbeat  . A bad rash all over your body  . Dizziness and weakness    Immunizations Administered    Name Date Dose VIS Date Route   Pfizer COVID-19 Vaccine 09/24/2019 10:12 AM 0.3 mL 08/04/2019 Intramuscular   Manufacturer: [place]   Lot: [id number]   [place]: [id number]

## 2019-10-05 DIAGNOSIS — E7800 Pure hypercholesterolemia, unspecified: Secondary | ICD-10-CM | POA: Diagnosis not present

## 2019-10-05 DIAGNOSIS — D721 Eosinophilia, unspecified: Secondary | ICD-10-CM | POA: Diagnosis not present

## 2019-11-09 DIAGNOSIS — Z23 Encounter for immunization: Secondary | ICD-10-CM | POA: Diagnosis not present

## 2019-11-20 DIAGNOSIS — I252 Old myocardial infarction: Secondary | ICD-10-CM | POA: Diagnosis not present

## 2019-11-20 DIAGNOSIS — H40059 Ocular hypertension, unspecified eye: Secondary | ICD-10-CM | POA: Diagnosis not present

## 2019-11-20 DIAGNOSIS — E7800 Pure hypercholesterolemia, unspecified: Secondary | ICD-10-CM | POA: Diagnosis not present

## 2019-11-20 DIAGNOSIS — J449 Chronic obstructive pulmonary disease, unspecified: Secondary | ICD-10-CM | POA: Diagnosis not present

## 2019-11-20 DIAGNOSIS — Z8546 Personal history of malignant neoplasm of prostate: Secondary | ICD-10-CM | POA: Diagnosis not present

## 2019-11-20 DIAGNOSIS — I2510 Atherosclerotic heart disease of native coronary artery without angina pectoris: Secondary | ICD-10-CM | POA: Diagnosis not present

## 2019-12-05 DIAGNOSIS — L812 Freckles: Secondary | ICD-10-CM | POA: Diagnosis not present

## 2019-12-05 DIAGNOSIS — L858 Other specified epidermal thickening: Secondary | ICD-10-CM | POA: Diagnosis not present

## 2019-12-05 DIAGNOSIS — Z85828 Personal history of other malignant neoplasm of skin: Secondary | ICD-10-CM | POA: Diagnosis not present

## 2019-12-05 DIAGNOSIS — L821 Other seborrheic keratosis: Secondary | ICD-10-CM | POA: Diagnosis not present

## 2020-01-03 DIAGNOSIS — H40013 Open angle with borderline findings, low risk, bilateral: Secondary | ICD-10-CM | POA: Diagnosis not present

## 2020-01-16 ENCOUNTER — Other Ambulatory Visit: Payer: Self-pay | Admitting: Cardiovascular Disease

## 2020-01-28 IMAGING — CR LUMBAR SPINE - 2-3 VIEW
2 series · 2 of 2 positions shown · non-contrast
Comparison: MRI of lumbar spine October 05, 2018

CLINICAL DATA: Localization image.  Lumbar radiculopathy.

EXAM:
LUMBAR SPINE - 2-3 VIEW

[lateral (1 of 2)]
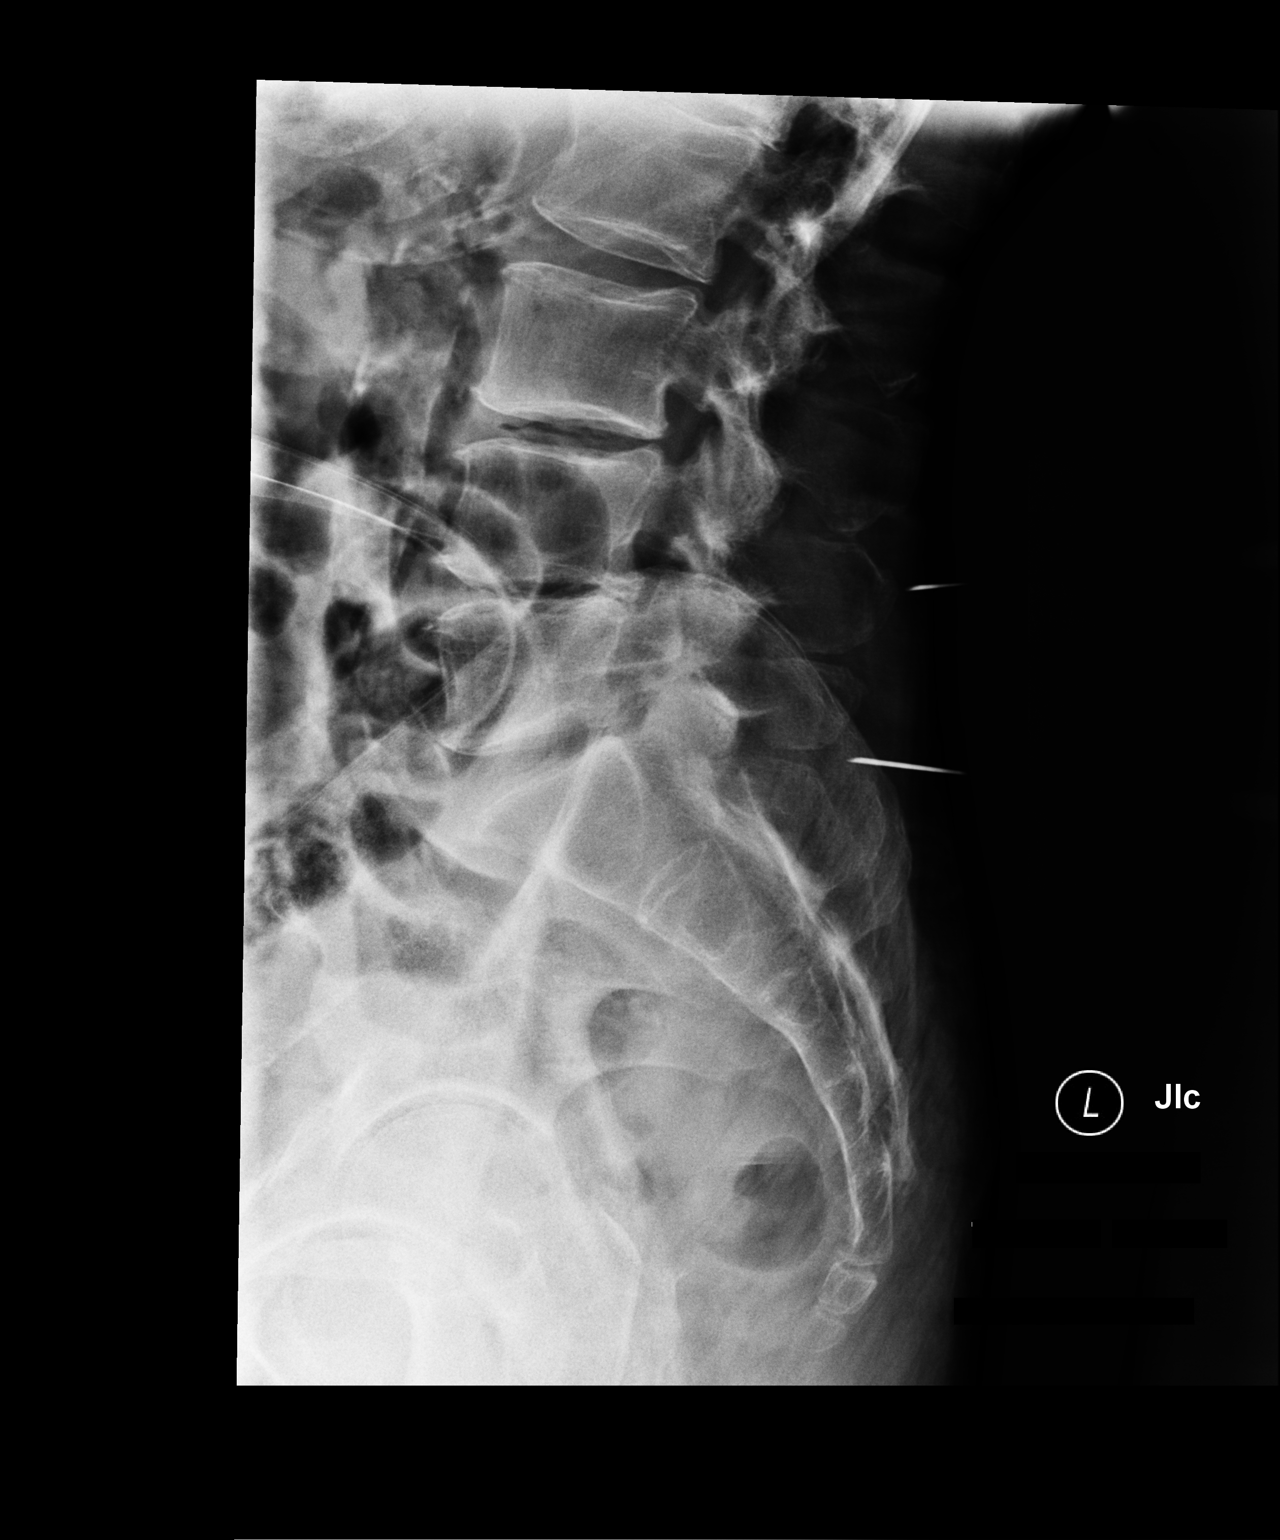

[lateral (2 of 2)]
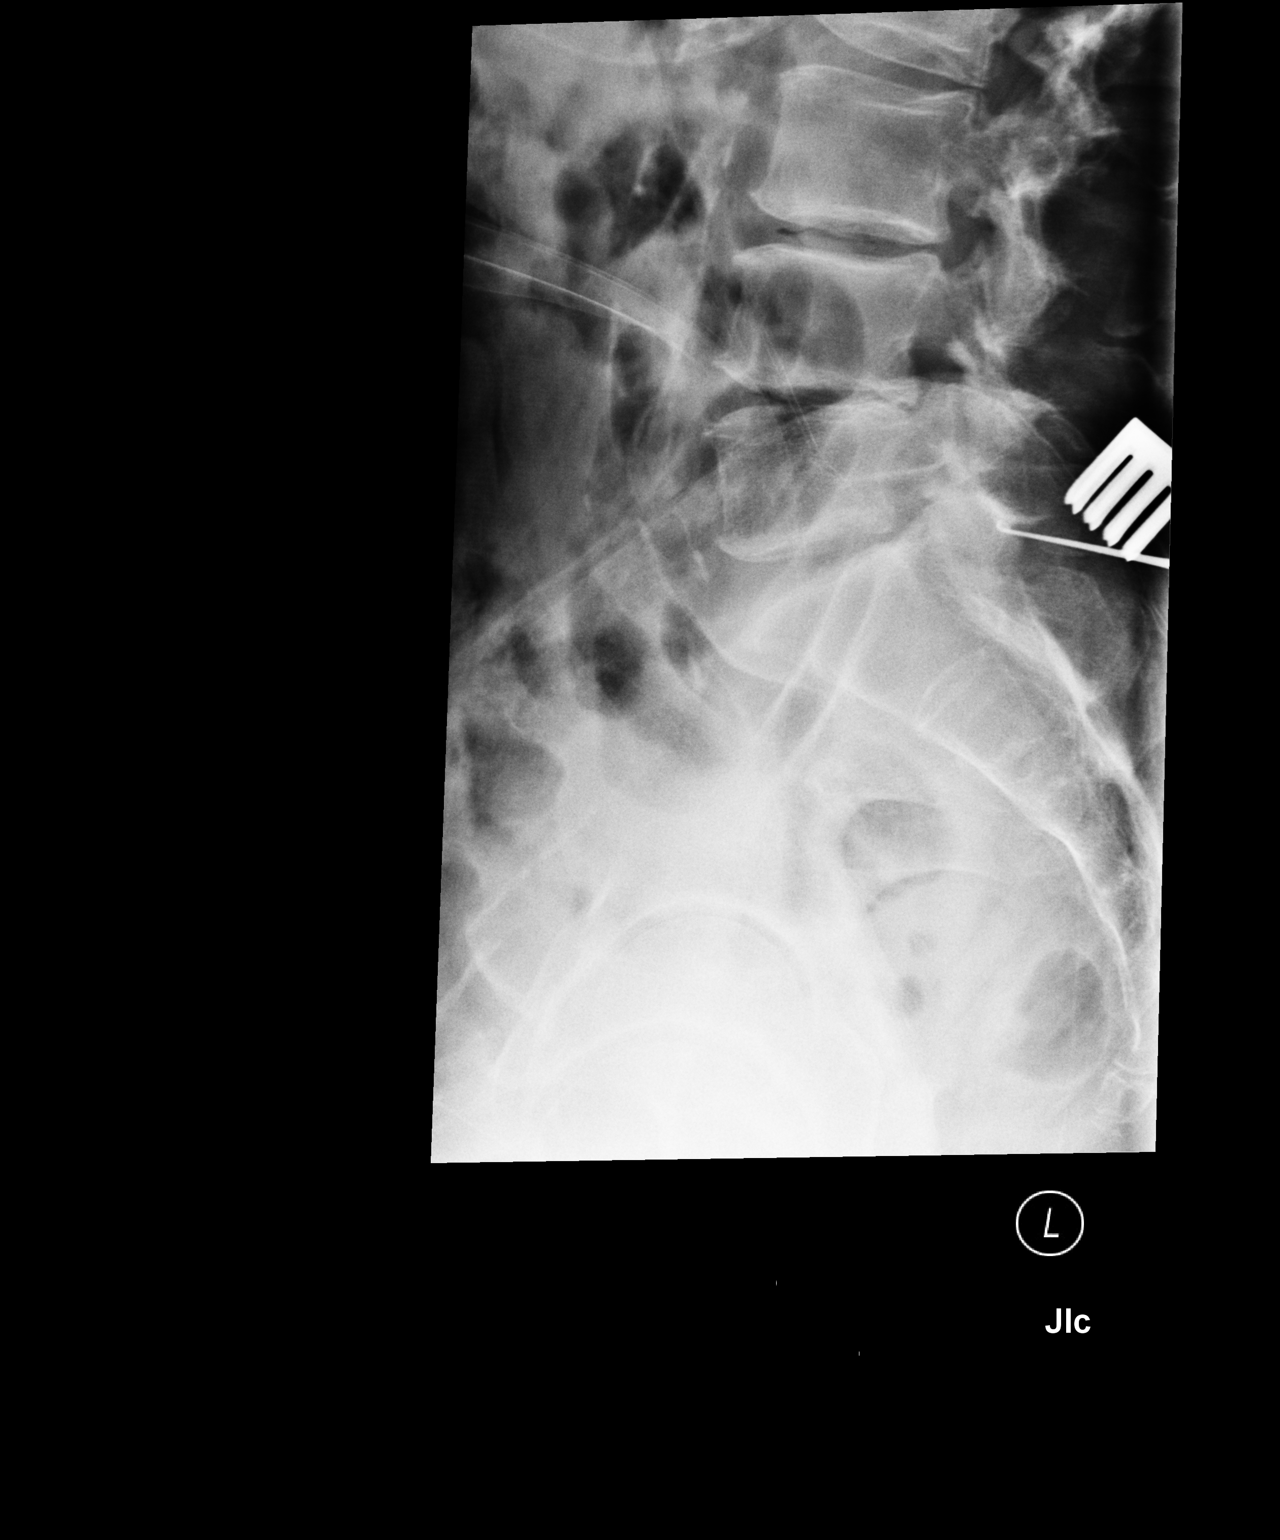

[2 of 2 positions shown; findings below may reference images not displayed]

FINDINGS: There are 2 radiopaque needles 1 projected at the posterior soft
tissues at L4-5 level and the other projected at L5-S1 level.
IMPRESSION: Localization image of lateral lumbar spine as described.

## 2020-03-19 DIAGNOSIS — E7800 Pure hypercholesterolemia, unspecified: Secondary | ICD-10-CM | POA: Diagnosis not present

## 2020-03-19 DIAGNOSIS — I252 Old myocardial infarction: Secondary | ICD-10-CM | POA: Diagnosis not present

## 2020-03-19 DIAGNOSIS — I2510 Atherosclerotic heart disease of native coronary artery without angina pectoris: Secondary | ICD-10-CM | POA: Diagnosis not present

## 2020-03-19 DIAGNOSIS — Z8546 Personal history of malignant neoplasm of prostate: Secondary | ICD-10-CM | POA: Diagnosis not present

## 2020-03-19 DIAGNOSIS — J449 Chronic obstructive pulmonary disease, unspecified: Secondary | ICD-10-CM | POA: Diagnosis not present

## 2020-03-19 DIAGNOSIS — H40059 Ocular hypertension, unspecified eye: Secondary | ICD-10-CM | POA: Diagnosis not present

## 2020-04-11 ENCOUNTER — Other Ambulatory Visit: Payer: Self-pay

## 2020-04-11 ENCOUNTER — Other Ambulatory Visit: Payer: Medicare Other | Admitting: *Deleted

## 2020-04-11 DIAGNOSIS — I10 Essential (primary) hypertension: Secondary | ICD-10-CM

## 2020-04-11 DIAGNOSIS — I2510 Atherosclerotic heart disease of native coronary artery without angina pectoris: Secondary | ICD-10-CM

## 2020-04-11 DIAGNOSIS — E785 Hyperlipidemia, unspecified: Secondary | ICD-10-CM | POA: Diagnosis not present

## 2020-04-11 DIAGNOSIS — Z9861 Coronary angioplasty status: Secondary | ICD-10-CM | POA: Diagnosis not present

## 2020-04-11 LAB — BASIC METABOLIC PANEL
BUN/Creatinine Ratio: 21 (ref 10–24)
BUN: 18 mg/dL (ref 8–27)
CO2: 26 mmol/L (ref 20–29)
Calcium: 9.5 mg/dL (ref 8.6–10.2)
Chloride: 94 mmol/L — ABNORMAL LOW (ref 96–106)
Creatinine, Ser: 0.87 mg/dL (ref 0.76–1.27)
GFR calc Af Amer: 96 mL/min/{1.73_m2} (ref >59)
GFR calc non Af Amer: 83 mL/min/{1.73_m2} (ref >59)
Glucose: 102 mg/dL — ABNORMAL HIGH (ref 65–99)
Potassium: 4.9 mmol/L (ref 3.5–5.2)
Sodium: 133 mmol/L — ABNORMAL LOW (ref 134–144)

## 2020-04-11 LAB — HEPATIC FUNCTION PANEL
ALT: 18 IU/L (ref 0–44)
AST: 21 IU/L (ref 0–40)
Albumin: 4.3 g/dL (ref 3.7–4.7)
Alkaline Phosphatase: 64 IU/L (ref 48–121)
Bilirubin Total: 1 mg/dL (ref 0.0–1.2)
Bilirubin, Direct: 0.26 mg/dL (ref 0.00–0.40)
Total Protein: 6.9 g/dL (ref 6.0–8.5)

## 2020-04-11 LAB — LIPID PANEL
Chol/HDL Ratio: 2.2 ratio (ref 0.0–5.0)
Cholesterol, Total: 180 mg/dL (ref 100–199)
HDL: 83 mg/dL (ref >39)
LDL Chol Calc (NIH): 85 mg/dL (ref 0–99)
Triglycerides: 60 mg/dL (ref 0–149)
VLDL Cholesterol Cal: 12 mg/dL (ref 5–40)

## 2020-04-15 ENCOUNTER — Other Ambulatory Visit: Payer: Self-pay

## 2020-04-15 ENCOUNTER — Encounter: Payer: Self-pay | Admitting: Cardiovascular Disease

## 2020-04-15 ENCOUNTER — Ambulatory Visit (INDEPENDENT_AMBULATORY_CARE_PROVIDER_SITE_OTHER): Payer: Medicare Other | Admitting: Cardiovascular Disease

## 2020-04-15 VITALS — BP 132/82 | HR 65 | Ht 67.0 in | Wt 172.0 lb

## 2020-04-15 DIAGNOSIS — I2510 Atherosclerotic heart disease of native coronary artery without angina pectoris: Secondary | ICD-10-CM

## 2020-04-15 DIAGNOSIS — I10 Essential (primary) hypertension: Secondary | ICD-10-CM

## 2020-04-15 DIAGNOSIS — Z9861 Coronary angioplasty status: Secondary | ICD-10-CM | POA: Diagnosis not present

## 2020-04-15 DIAGNOSIS — E785 Hyperlipidemia, unspecified: Secondary | ICD-10-CM | POA: Diagnosis not present
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
## 2020-04-15 NOTE — Patient Instructions (Signed)
Medication Instructions:  Your physician recommends that you continue on your current medications as directed. Please refer to the Current Medication list given to you today.  *If you need a refill on your cardiac medications before your next appointment, please call your pharmacy*   Lab Work: BMET, Liver, Lipid and CBC with diff prior to or on the same day as your next appointment.  You will need to be fasting for these labs (nothing to eat or drink after midnight except water and black coffee).  If you have labs (blood work) drawn today and your tests are completely normal, you will receive your results only by: Marland Kitchen MyChart Message (if you have MyChart) OR . A paper copy in the mail If you have any lab test that is abnormal or we need to change your treatment, we will call you to review the results.   Testing/Procedures: None   Follow-Up: At [facility], you and your health needs are our priority.  As part of our continuing mission to provide you with exceptional heart care, we have created designated Provider Care Teams.  These Care Teams include your primary Cardiologist (physician) and Advanced Practice Providers (APPs -  Physician Assistants and Nurse Practitioners) who all work together to provide you with the care you need, when you need it.  We recommend signing up for the patient portal called "MyChart".  Sign up information is provided on this After Visit Summary.  MyChart is used to connect with patients for Virtual Visits (Telemedicine).  Patients are able to view lab/test results, encounter notes, upcoming appointments, etc.  Non-urgent messages can be sent to your provider as well.   To learn more about what you can do with MyChart, go to [email or web address].    Your next appointment:   10 month(s)  The format for your next appointment:   In Person  Provider:   You may see Dr. Mertie Moores or one of the following Advanced Practice Providers on your designated Care  Team:    Richardson Dopp, PA-C  Robbie Lis, [place]    Other Instructions

## 2020-05-16 DIAGNOSIS — R05 Cough: Secondary | ICD-10-CM | POA: Diagnosis not present

## 2020-05-16 DIAGNOSIS — R197 Diarrhea, unspecified: Secondary | ICD-10-CM | POA: Diagnosis not present

## 2020-05-19 ENCOUNTER — Encounter: Payer: Self-pay | Admitting: Nurse Practitioner

## 2020-05-19 ENCOUNTER — Telehealth (HOSPITAL_COMMUNITY): Payer: Self-pay | Admitting: Nurse Practitioner

## 2020-05-19 ENCOUNTER — Other Ambulatory Visit (HOSPITAL_COMMUNITY): Payer: Self-pay | Admitting: Nurse Practitioner

## 2020-05-19 DIAGNOSIS — U071 COVID-19: Secondary | ICD-10-CM

## 2020-05-19 NOTE — Telephone Encounter (Signed)
Called to Discuss with patient about Covid symptoms and the use of regeneron, a monoclonal antibody infusion for those with mild to moderate Covid symptoms and at a high risk of hospitalization.     Pt is qualified for this infusion at the [facility] infusion center due to co-morbid conditions and/or a member of an at-risk group.     Unable to reach pt. Left message to return call. Sent mychart message.   Jay Montesano, DNP, AGNP-C [phone] (Infusion Center Hotline)  

## 2020-05-19 NOTE — [facility] Notes (Signed)
I connected by phone with Jay Wilkins on 05/19/2020 at 8:29 PM to discuss the potential use of an new treatment for mild to moderate COVID-19 viral infection in non-hospitalized patients.  This patient is a 79 y.o. male that meets the FDA criteria for Emergency Use Authorization of casirivimab\imdevimab.  Has a (+) direct SARS-CoV-2 viral test result  Has mild or moderate COVID-19   Is ? 79 years of age and weighs ? 40 kg  Is NOT hospitalized due to COVID-19  Is NOT requiring oxygen therapy or requiring an increase in baseline oxygen flow rate due to COVID-19  Is within 10 days of symptom onset  Has at least one of the high risk factor(s) for progression to severe COVID-19 and/or hospitalization as defined in EUA.  Specific high risk criteria : Older age (>/= 79 yo) and Cardiovascular disease or hypertension    Sx onset 9/21. Vaccinated.  I have spoken and communicated the following to the patient or parent/caregiver:  1. FDA has authorized the emergency use of casirivimab\imdevimab for the treatment of mild to moderate COVID-19 in adults and pediatric patients with positive results of direct SARS-CoV-2 viral testing who are 31 years of age and older weighing at least 40 kg, and who are at high risk for progressing to severe COVID-19 and/or hospitalization.  2. The significant known and potential risks and benefits of casirivimab\imdevimab, and the extent to which such potential risks and benefits are unknown.  3. Information on available alternative treatments and the risks and benefits of those alternatives, including clinical trials.  4. Patients treated with casirivimab\imdevimab should continue to self-isolate and use infection control measures (e.g., wear mask, isolate, social distance, avoid sharing personal items, clean and disinfect "high touch" surfaces, and frequent handwashing) according to CDC guidelines.   5. The patient or parent/caregiver has the option to accept or  refuse casirivimab\imdevimab .  After reviewing this information with the patient, the patient has agreed to receive one of the available covid 19 monoclonal antibodies and will be provided an appropriate fact sheet prior to infusion.[name], [name], AGNP-C [phone] ([place])

## 2020-05-20 ENCOUNTER — Other Ambulatory Visit (HOSPITAL_COMMUNITY): Payer: Self-pay

## 2020-05-20 ENCOUNTER — Ambulatory Visit (HOSPITAL_COMMUNITY)
Admission: RE | Admit: 2020-05-20 | Discharge: 2020-05-20 | Disposition: A | Discharge status: Home / Self Care | Payer: Medicare Other | Source: Ambulatory Visit | Attending: Pulmonary Disease | Admitting: Pulmonary Disease

## 2020-05-20 DIAGNOSIS — Z23 Encounter for immunization: Secondary | ICD-10-CM | POA: Diagnosis not present

## 2020-05-20 DIAGNOSIS — U071 COVID-19: Secondary | ICD-10-CM | POA: Diagnosis not present

## 2020-05-20 MED ORDER — DIPHENHYDRAMINE HCL 50 MG/ML IJ SOLN: 50.0000 mg | Freq: Once | INTRAMUSCULAR | Status: DC | PRN

## 2020-05-20 MED ORDER — METHYLPREDNISOLONE SODIUM SUCC 125 MG IJ SOLR: 125.0000 mg | Freq: Once | INTRAMUSCULAR | Status: DC | PRN

## 2020-05-20 MED ORDER — SODIUM CHLORIDE 0.9 % IV SOLN
1200.0000 mg | Freq: Once | INTRAVENOUS | Status: AC
  Administered 2020-05-20: 1200 mg via INTRAVENOUS

## 2020-05-20 MED ORDER — ALBUTEROL SULFATE HFA 108 (90 BASE) MCG/ACT IN AERS: 2.0000 | INHALATION_SPRAY | Freq: Once | RESPIRATORY_TRACT | Status: DC | PRN

## 2020-05-20 MED ORDER — FAMOTIDINE IN NACL 20-0.9 MG/50ML-% IV SOLN: 20.0000 mg | Freq: Once | INTRAVENOUS | Status: DC | PRN

## 2020-05-20 MED ORDER — EPINEPHRINE 0.3 MG/0.3ML IJ SOAJ: 0.3000 mg | Freq: Once | INTRAMUSCULAR | Status: DC | PRN

## 2020-05-20 MED ORDER — SODIUM CHLORIDE 0.9 % IV SOLN: INTRAVENOUS | Status: DC | PRN

## 2020-05-20 NOTE — Discharge Instructions (Signed)

## 2020-05-20 NOTE — [place] Notes (Signed)
  Diagnosis: COVID-19  Physician: dr Asencion Noble  Procedure: Covid Infusion Clinic Med: casirivimab\imdevimab infusion - Provided patient with casirivimab\imdevimab fact sheet for patients, parents and caregivers prior to infusion.  Complications: No immediate complications noted.  Discharge: Discharged home   Heide Scales 05/20/2020

## 2020-06-05 DIAGNOSIS — L812 Freckles: Secondary | ICD-10-CM | POA: Diagnosis not present

## 2020-06-05 DIAGNOSIS — L821 Other seborrheic keratosis: Secondary | ICD-10-CM | POA: Diagnosis not present

## 2020-06-05 DIAGNOSIS — L570 Actinic keratosis: Secondary | ICD-10-CM | POA: Diagnosis not present

## 2020-06-05 DIAGNOSIS — Z85828 Personal history of other malignant neoplasm of skin: Secondary | ICD-10-CM | POA: Diagnosis not present

## 2020-06-19 DIAGNOSIS — Z8546 Personal history of malignant neoplasm of prostate: Secondary | ICD-10-CM | POA: Diagnosis not present

## 2020-06-19 DIAGNOSIS — I2510 Atherosclerotic heart disease of native coronary artery without angina pectoris: Secondary | ICD-10-CM | POA: Diagnosis not present

## 2020-06-19 DIAGNOSIS — Z23 Encounter for immunization: Secondary | ICD-10-CM | POA: Diagnosis not present

## 2020-06-19 DIAGNOSIS — Z1389 Encounter for screening for other disorder: Secondary | ICD-10-CM | POA: Diagnosis not present

## 2020-06-19 DIAGNOSIS — E7800 Pure hypercholesterolemia, unspecified: Secondary | ICD-10-CM | POA: Diagnosis not present

## 2020-06-19 DIAGNOSIS — D721 Eosinophilia, unspecified: Secondary | ICD-10-CM | POA: Diagnosis not present

## 2020-06-19 DIAGNOSIS — Z0000 Encounter for general adult medical examination without abnormal findings: Secondary | ICD-10-CM | POA: Diagnosis not present

## 2020-06-19 DIAGNOSIS — K219 Gastro-esophageal reflux disease without esophagitis: Secondary | ICD-10-CM | POA: Diagnosis not present

## 2020-06-19 DIAGNOSIS — Z8616 Personal history of COVID-19: Secondary | ICD-10-CM | POA: Diagnosis not present

## 2020-06-19 DIAGNOSIS — J387 Other diseases of larynx: Secondary | ICD-10-CM | POA: Diagnosis not present

## 2020-06-19 DIAGNOSIS — J449 Chronic obstructive pulmonary disease, unspecified: Secondary | ICD-10-CM | POA: Diagnosis not present

## 2020-06-19 DIAGNOSIS — R946 Abnormal results of thyroid function studies: Secondary | ICD-10-CM | POA: Diagnosis not present

## 2020-07-10 DIAGNOSIS — H26492 Other secondary cataract, left eye: Secondary | ICD-10-CM | POA: Diagnosis not present

## 2020-07-10 DIAGNOSIS — H40013 Open angle with borderline findings, low risk, bilateral: Secondary | ICD-10-CM | POA: Diagnosis not present

## 2020-07-10 DIAGNOSIS — H52203 Unspecified astigmatism, bilateral: Secondary | ICD-10-CM | POA: Diagnosis not present

## 2020-07-10 DIAGNOSIS — H04123 Dry eye syndrome of bilateral lacrimal glands: Secondary | ICD-10-CM | POA: Diagnosis not present

## 2020-08-21 DIAGNOSIS — Z23 Encounter for immunization: Secondary | ICD-10-CM | POA: Diagnosis not present

## 2020-09-09 DIAGNOSIS — E7800 Pure hypercholesterolemia, unspecified: Secondary | ICD-10-CM | POA: Diagnosis not present

## 2020-09-09 DIAGNOSIS — Z8546 Personal history of malignant neoplasm of prostate: Secondary | ICD-10-CM | POA: Diagnosis not present

## 2020-09-09 DIAGNOSIS — H40059 Ocular hypertension, unspecified eye: Secondary | ICD-10-CM | POA: Diagnosis not present

## 2020-09-09 DIAGNOSIS — K219 Gastro-esophageal reflux disease without esophagitis: Secondary | ICD-10-CM | POA: Diagnosis not present

## 2020-09-09 DIAGNOSIS — I2510 Atherosclerotic heart disease of native coronary artery without angina pectoris: Secondary | ICD-10-CM | POA: Diagnosis not present

## 2020-09-09 DIAGNOSIS — J449 Chronic obstructive pulmonary disease, unspecified: Secondary | ICD-10-CM | POA: Diagnosis not present

## 2020-09-09 DIAGNOSIS — G8929 Other chronic pain: Secondary | ICD-10-CM | POA: Diagnosis not present

## 2020-09-09 DIAGNOSIS — I252 Old myocardial infarction: Secondary | ICD-10-CM | POA: Diagnosis not present

## 2020-09-24 ENCOUNTER — Other Ambulatory Visit: Payer: Self-pay

## 2020-09-24 ENCOUNTER — Encounter (HOSPITAL_COMMUNITY): Payer: Self-pay

## 2020-09-24 ENCOUNTER — Ambulatory Visit (HOSPITAL_COMMUNITY)
Admission: EM | Admit: 2020-09-24 | Discharge: 2020-09-24 | Disposition: A | Discharge status: Home / Self Care | Payer: Medicare Other | Attending: Urgent Care | Admitting: Urgent Care

## 2020-09-24 DIAGNOSIS — R030 Elevated blood-pressure reading, without diagnosis of hypertension: Secondary | ICD-10-CM

## 2020-09-24 DIAGNOSIS — I10 Essential (primary) hypertension: Secondary | ICD-10-CM | POA: Diagnosis not present

## 2020-09-24 NOTE — Discharge Instructions (Addendum)
Please contact your cardiologist, heart doctor, as soon as possible tomorrow morning for an urgent visit on your blood pressure.
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
## 2020-09-24 NOTE — ED Triage Notes (Signed)
Pt c/o stress and ringing and the ear. He states he felt he had a HBP. He states he took his BP yesterday.  He states it started at 171/91 and went up to 195/108. Pt states he is not having chest pain, he states he feel tension on both arms. Pt denies SOB.

## 2020-09-25 NOTE — Telephone Encounter (Signed)
Called patient back to discuss Dr. Elmarie Shiley message. Made patient an appointment with Dr. Gasper Sells on Monday. Patient agreed to plan. Will send message to Dr. Gasper Sells so he is aware.

## 2020-09-30 ENCOUNTER — Ambulatory Visit (INDEPENDENT_AMBULATORY_CARE_PROVIDER_SITE_OTHER): Payer: Medicare Other | Admitting: Internal Medicine

## 2020-09-30 ENCOUNTER — Encounter: Payer: Self-pay | Admitting: *Deleted

## 2020-09-30 ENCOUNTER — Other Ambulatory Visit: Payer: Self-pay

## 2020-09-30 ENCOUNTER — Encounter: Payer: Self-pay | Admitting: Internal Medicine

## 2020-09-30 VITALS — BP 148/92 | HR 60 | Ht 67.0 in | Wt 173.0 lb

## 2020-09-30 DIAGNOSIS — E782 Mixed hyperlipidemia: Secondary | ICD-10-CM | POA: Diagnosis not present

## 2020-09-30 DIAGNOSIS — I10 Essential (primary) hypertension: Secondary | ICD-10-CM | POA: Diagnosis not present

## 2020-09-30 DIAGNOSIS — R0600 Dyspnea, unspecified: Secondary | ICD-10-CM

## 2020-09-30 DIAGNOSIS — Z9861 Coronary angioplasty status: Secondary | ICD-10-CM

## 2020-09-30 DIAGNOSIS — I2510 Atherosclerotic heart disease of native coronary artery without angina pectoris: Secondary | ICD-10-CM

## 2020-09-30 DIAGNOSIS — D721 Eosinophilia, unspecified: Secondary | ICD-10-CM

## 2020-09-30 DIAGNOSIS — J438 Other emphysema: Secondary | ICD-10-CM | POA: Diagnosis not present

## 2020-09-30 DIAGNOSIS — I255 Ischemic cardiomyopathy: Secondary | ICD-10-CM

## 2020-09-30 DIAGNOSIS — R0609 Other forms of dyspnea: Secondary | ICD-10-CM

## 2020-09-30 DIAGNOSIS — R0602 Shortness of breath: Secondary | ICD-10-CM | POA: Diagnosis not present

## 2020-09-30 MED ORDER — LOSARTAN POTASSIUM 25 MG PO TABS: 25.0000 mg | ORAL_TABLET | Freq: Every day | ORAL | 3 refills | Status: DC

## 2020-09-30 NOTE — Patient Instructions (Signed)
Medication Instructions:  START:  Losartan 25mg  daily  *If you need a refill on your cardiac medications before your next appointment, please call your pharmacy*   Lab Work: TODAY: ProBNP and CBC with diff  7-10 days: BMP  If you have labs (blood work) drawn today and your tests are completely normal, you will receive your results only by: Marland Kitchen MyChart Message (if you have MyChart) OR . A paper copy in the mail If you have any lab test that is abnormal or we need to change your treatment, we will call you to review the results.   Testing/Procedures: Your physician has requested that you have an echocardiogram. Echocardiography is a painless test that uses sound waves to create images of your heart. It provides your doctor with information about the size and shape of your heart and how well your heart's chambers and valves are working. This procedure takes approximately one hour. There are no restrictions for this procedure.     Follow-Up: At [facility], you and your health needs are our priority.  As part of our continuing mission to provide you with exceptional heart care, we have created designated Provider Care Teams.  These Care Teams include your primary Cardiologist (physician) and Advanced Practice Providers (APPs -  Physician Assistants and Nurse Practitioners) who all work together to provide you with the care you need, when you need it.      Your next appointment:   3-4 months  The format for your next appointment:   In Person  Provider:   You may see Gasper Sells, MD or one of the following Advanced Practice Providers on your designated Care Team:    Melina Copa, PA-C  Ermalinda Barrios, PA-C
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
## 2020-10-01 LAB — CBC WITH DIFFERENTIAL/PLATELET
Basophils Absolute: 0.1 10*3/uL (ref 0.0–0.2)
Basos: 0 %
EOS (ABSOLUTE): 4.9 10*3/uL — ABNORMAL HIGH (ref 0.0–0.4)
Eos: 42 %
Hematocrit: 42.8 % (ref 37.5–51.0)
Hemoglobin: 15.2 g/dL (ref 13.0–17.7)
Immature Grans (Abs): 0 10*3/uL (ref 0.0–0.1)
Immature Granulocytes: 0 %
Lymphocytes Absolute: 1.6 10*3/uL (ref 0.7–3.1)
Lymphs: 14 %
MCH: 33.9 pg — ABNORMAL HIGH (ref 26.6–33.0)
MCHC: 35.5 g/dL (ref 31.5–35.7)
MCV: 96 fL (ref 79–97)
Monocytes Absolute: 1 10*3/uL — ABNORMAL HIGH (ref 0.1–0.9)
Monocytes: 8 %
Neutrophils Absolute: 4.2 10*3/uL (ref 1.4–7.0)
Neutrophils: 36 %
Platelets: 235 10*3/uL (ref 150–450)
RBC: 4.48 x10E6/uL (ref 4.14–5.80)
RDW: 12.1 % (ref 11.6–15.4)
WBC: 11.8 10*3/uL — ABNORMAL HIGH (ref 3.4–10.8)

## 2020-10-01 LAB — PRO B NATRIURETIC PEPTIDE: NT-Pro BNP: 445 pg/mL (ref 0–486)

## 2020-10-03 ENCOUNTER — Other Ambulatory Visit: Payer: Self-pay | Admitting: *Deleted

## 2020-10-03 ENCOUNTER — Encounter: Payer: Self-pay | Admitting: *Deleted

## 2020-10-03 DIAGNOSIS — I2510 Atherosclerotic heart disease of native coronary artery without angina pectoris: Secondary | ICD-10-CM

## 2020-10-03 MED ORDER — NITROGLYCERIN 0.4 MG SL SUBL: 0.4000 mg | SUBLINGUAL_TABLET | SUBLINGUAL | 3 refills | Status: DC | PRN

## 2020-10-10 ENCOUNTER — Other Ambulatory Visit: Payer: Medicare Other | Admitting: *Deleted

## 2020-10-10 ENCOUNTER — Other Ambulatory Visit: Payer: Self-pay

## 2020-10-10 DIAGNOSIS — R0609 Other forms of dyspnea: Secondary | ICD-10-CM

## 2020-10-10 DIAGNOSIS — R0600 Dyspnea, unspecified: Secondary | ICD-10-CM

## 2020-10-10 DIAGNOSIS — I10 Essential (primary) hypertension: Secondary | ICD-10-CM | POA: Diagnosis not present

## 2020-10-10 DIAGNOSIS — I2510 Atherosclerotic heart disease of native coronary artery without angina pectoris: Secondary | ICD-10-CM

## 2020-10-10 DIAGNOSIS — I255 Ischemic cardiomyopathy: Secondary | ICD-10-CM | POA: Diagnosis not present

## 2020-10-10 LAB — BASIC METABOLIC PANEL
BUN/Creatinine Ratio: 24 (ref 10–24)
BUN: 21 mg/dL (ref 8–27)
CO2: 22 mmol/L (ref 20–29)
Calcium: 8.9 mg/dL (ref 8.6–10.2)
Chloride: 97 mmol/L (ref 96–106)
Creatinine, Ser: 0.86 mg/dL (ref 0.76–1.27)
GFR calc Af Amer: 95 mL/min/{1.73_m2} (ref >59)
GFR calc non Af Amer: 82 mL/min/{1.73_m2} (ref >59)
Glucose: 115 mg/dL — ABNORMAL HIGH (ref 65–99)
Potassium: 4.6 mmol/L (ref 3.5–5.2)
Sodium: 134 mmol/L (ref 134–144)

## 2020-10-16 NOTE — Telephone Encounter (Signed)
This encounter was created in error - please disregard.

## 2020-10-22 ENCOUNTER — Ambulatory Visit (HOSPITAL_COMMUNITY): Payer: Medicare Other | Attending: Cardiovascular Disease

## 2020-10-22 ENCOUNTER — Other Ambulatory Visit: Payer: Self-pay

## 2020-10-22 DIAGNOSIS — I255 Ischemic cardiomyopathy: Secondary | ICD-10-CM | POA: Diagnosis not present

## 2020-10-22 DIAGNOSIS — R0609 Other forms of dyspnea: Secondary | ICD-10-CM

## 2020-10-22 DIAGNOSIS — R0600 Dyspnea, unspecified: Secondary | ICD-10-CM | POA: Insufficient documentation

## 2020-10-22 DIAGNOSIS — I10 Essential (primary) hypertension: Secondary | ICD-10-CM | POA: Diagnosis not present

## 2020-10-22 DIAGNOSIS — I2510 Atherosclerotic heart disease of native coronary artery without angina pectoris: Secondary | ICD-10-CM | POA: Insufficient documentation

## 2020-10-22 LAB — ECHOCARDIOGRAM COMPLETE
Area-P 1/2: 3.51 cm2
MV M vel: 6.12 m/s
MV Peak grad: 149.8 mmHg
Radius: 0.5 cm
S' Lateral: 4 cm

## 2020-10-23 ENCOUNTER — Telehealth: Payer: Self-pay | Admitting: Emergency Medicine

## 2020-10-23 NOTE — Telephone Encounter (Signed)
I have called the pt and he is scheduled to see RB tomorrow at 10;30.

## 2020-10-24 ENCOUNTER — Other Ambulatory Visit: Payer: Self-pay

## 2020-10-24 ENCOUNTER — Ambulatory Visit (INDEPENDENT_AMBULATORY_CARE_PROVIDER_SITE_OTHER): Payer: Medicare Other | Admitting: Emergency Medicine

## 2020-10-24 ENCOUNTER — Encounter: Payer: Self-pay | Admitting: Emergency Medicine

## 2020-10-24 ENCOUNTER — Other Ambulatory Visit: Payer: Medicare Other

## 2020-10-24 DIAGNOSIS — I10 Essential (primary) hypertension: Secondary | ICD-10-CM

## 2020-10-24 DIAGNOSIS — I2510 Atherosclerotic heart disease of native coronary artery without angina pectoris: Secondary | ICD-10-CM

## 2020-10-24 DIAGNOSIS — J438 Other emphysema: Secondary | ICD-10-CM

## 2020-10-24 DIAGNOSIS — I255 Ischemic cardiomyopathy: Secondary | ICD-10-CM

## 2020-10-24 DIAGNOSIS — E782 Mixed hyperlipidemia: Secondary | ICD-10-CM

## 2020-10-24 MED ORDER — STIOLTO RESPIMAT 2.5-2.5 MCG/ACT IN AERS: 2.0000 | INHALATION_SPRAY | Freq: Every day | RESPIRATORY_TRACT | 0 refills | Status: DC

## 2020-10-24 MED ORDER — ALBUTEROL SULFATE HFA 108 (90 BASE) MCG/ACT IN AERS: 2.0000 | INHALATION_SPRAY | RESPIRATORY_TRACT | 11 refills | Status: DC | PRN

## 2020-10-24 NOTE — Progress Notes (Signed)
Called patient to ensure he is agreeable with Dr. Oralia Rud plan for CT of aorta based on the results of his Echo.  This test needs to be performed prior to his May OV with Dr. Gasper Sells. Patient agreeable order placed will send to scheduling pool.

## 2020-10-24 NOTE — Addendum Note (Signed)
Addended by: Gavin Potters R on: 10/24/2020 11:17 AM   Modules accepted: Orders

## 2020-10-24 NOTE — Patient Instructions (Addendum)
We will try starting Stiolto 2 puffs once a day.  Take it every day on a schedule.  Contact us in about 2 weeks to let us know whether you feel it is been beneficial.  If so then we will send a prescription to your pharmacy so that we can continue it. Okay to continue to keep your albuterol available to use 2 puffs if needed for shortness of breath, chest tightness, wheezing. Follow-up in 3 months.  Depending on how your breathing is doing we will decide if any other testing would be helpful

## 2020-10-24 NOTE — Progress Notes (Signed)
   Subjective:    Patient ID: Jay Wilkins, male    DOB: 1941/01/16, 80 y.o.   MRN: [id number]    HPI  ROV 10/24/20 --acute office visit 80 year old gentleman who has severe COPD and obstructive disease, chronic cough in the setting of allergic rhinitis, GERD. Has CAD.  Also history of chronically elevated eosinophils with a negative bone marrow biopsy 2017 and reassuring molecular testing.  Currently managed on Flonase, Nexium, albuterol which he uses approximately.  He describes some gradual increase in exertional SOB and fatigue over last several months. He noted that his BP was high - started losartan 2 weeks ago. Has noticed more SOB when he was doing his daily exercise - he was off it for a while when he had COVID-19 in September 2021. He is hearing some new wheeze, hears it rarely.  He is back on Nexium for flaring of his GERD starting 10 days ago.   TTE done 10/22/20 >> stable EF at 45%, normal valves  MDM:  Reviewed echocardiogram Reviewed cardiology note 09/30/2020   Review of Systems  Constitutional: Negative for fever and unexpected weight change.  HENT: Positive for sore throat. Negative for congestion, dental problem, ear pain, nosebleeds, postnasal drip, rhinorrhea, sinus pressure, sneezing and trouble swallowing.   Eyes: Negative for redness and itching.  Respiratory: Positive for cough. Negative for chest tightness, shortness of breath and wheezing.   Cardiovascular: Negative for palpitations and leg swelling.  Gastrointestinal: Negative for nausea and vomiting.  Genitourinary: Negative for dysuria.  Musculoskeletal: Negative for joint swelling.  Skin: Negative for rash.  Neurological: Negative for headaches.  Hematological: Does not bruise/bleed easily.  Psychiatric/Behavioral: Negative for dysphoric mood. The patient is not nervous/anxious.        Objective:   Physical Exam Vitals:   10/24/20 1038  BP: 140/80  Pulse: 74  Temp: 97.8 F (36.6 C)  TempSrc: Temporal   SpO2: 97%  Weight: 173 lb 9.6 oz (78.7 kg)  Height: $Remove'5\' 7"'niLoMpB$  (1.702 m)   Gen: Pleasant, well-nourished, in no distress,  normal affect  ENT: No lesions,  mouth clear,  oropharynx clear w some mild erythema, no postnasal drip  Neck: No JVD, no stridor  Lungs: No use of accessory muscles, clear without rales or rhonchi  Cardiovascular: RRR, heart sounds normal, no murmur or gallops, no peripheral edema  Musculoskeletal: No deformities, no cyanosis or clubbing  Neuro: alert, non focal  Skin: Warm, no lesions or rashes     Assessment & Plan:  COPD with emphysema (HCC) Significant COPD noted by prior PFT, but he never been on scheduled bronchodilator therapy because his functional capacity was good.  He is now noticing exertional symptoms.  He should definitely start BD therapy to see if he gets benefit.  I will start [place].  He will contact me after a few weeks to report improvement.  If he does improve then we will prescribe through his pharmacy.  Keep albuterol available.  Depending on how his breathing progresses we will consider walking oximetry, consider repeat PFT.  His EF was stable on recent echocardiogram  Baltazar Apo, MD, PhD 10/24/2020, 11:04 AM [facility] [phone] or if no answer [phone]

## 2020-10-24 NOTE — Progress Notes (Signed)
Orders placed for FLP and CMP per Dr. Gasper Sells result note.

## 2020-10-24 NOTE — Assessment & Plan Note (Signed)
Significant COPD noted by prior PFT, but he never been on scheduled bronchodilator therapy because his functional capacity was good.  He is now noticing exertional symptoms.  He should definitely start BD therapy to see if he gets benefit.  I will start [place].  He will contact me after a few weeks to report improvement.  If he does improve then we will prescribe through his pharmacy.  Keep albuterol available.  Depending on how his breathing progresses we will consider walking oximetry, consider repeat PFT.  His EF was stable on recent echocardiogram

## 2020-11-06 ENCOUNTER — Other Ambulatory Visit: Payer: Self-pay

## 2020-11-06 ENCOUNTER — Other Ambulatory Visit: Payer: Medicare Other | Admitting: *Deleted

## 2020-11-06 DIAGNOSIS — I255 Ischemic cardiomyopathy: Secondary | ICD-10-CM | POA: Diagnosis not present

## 2020-11-06 DIAGNOSIS — E782 Mixed hyperlipidemia: Secondary | ICD-10-CM | POA: Diagnosis not present

## 2020-11-06 DIAGNOSIS — I2510 Atherosclerotic heart disease of native coronary artery without angina pectoris: Secondary | ICD-10-CM

## 2020-11-06 DIAGNOSIS — I10 Essential (primary) hypertension: Secondary | ICD-10-CM

## 2020-11-06 LAB — BASIC METABOLIC PANEL
BUN/Creatinine Ratio: 20 (ref 10–24)
BUN: 18 mg/dL (ref 8–27)
CO2: 23 mmol/L (ref 20–29)
Calcium: 8.9 mg/dL (ref 8.6–10.2)
Chloride: 92 mmol/L — ABNORMAL LOW (ref 96–106)
Creatinine, Ser: 0.88 mg/dL (ref 0.76–1.27)
Glucose: 92 mg/dL (ref 65–99)
Potassium: 4.3 mmol/L (ref 3.5–5.2)
Sodium: 131 mmol/L — ABNORMAL LOW (ref 134–144)
eGFR: 87 mL/min/{1.73_m2} (ref >59)

## 2020-11-06 LAB — LIPID PANEL
Chol/HDL Ratio: 2.1 ratio (ref 0.0–5.0)
Cholesterol, Total: 140 mg/dL (ref 100–199)
HDL: 67 mg/dL (ref >39)
LDL Chol Calc (NIH): 59 mg/dL (ref 0–99)
Triglycerides: 72 mg/dL (ref 0–149)
VLDL Cholesterol Cal: 14 mg/dL (ref 5–40)

## 2020-11-14 ENCOUNTER — Ambulatory Visit
Admission: RE | Admit: 2020-11-14 | Discharge: 2020-11-14 | Disposition: A | Discharge status: Home / Self Care | Payer: Medicare Other | Source: Ambulatory Visit | Attending: Internal Medicine | Admitting: Internal Medicine

## 2020-11-14 DIAGNOSIS — J439 Emphysema, unspecified: Secondary | ICD-10-CM | POA: Diagnosis not present

## 2020-11-14 DIAGNOSIS — J984 Other disorders of lung: Secondary | ICD-10-CM | POA: Diagnosis not present

## 2020-11-14 DIAGNOSIS — I2510 Atherosclerotic heart disease of native coronary artery without angina pectoris: Secondary | ICD-10-CM | POA: Diagnosis not present

## 2020-11-14 DIAGNOSIS — I255 Ischemic cardiomyopathy: Secondary | ICD-10-CM

## 2020-11-14 DIAGNOSIS — I712 Thoracic aortic aneurysm, without rupture: Secondary | ICD-10-CM | POA: Diagnosis not present

## 2020-11-14 DIAGNOSIS — I10 Essential (primary) hypertension: Secondary | ICD-10-CM

## 2020-11-14 MED ORDER — IOPAMIDOL (ISOVUE-370) INJECTION 76%
75.0000 mL | Freq: Once | INTRAVENOUS | Status: AC | PRN
  Administered 2020-11-14: 75 mL via INTRAVENOUS

## 2020-11-22 MED ORDER — TIOTROPIUM BROMIDE-OLODATEROL 2.5-2.5 MCG/ACT IN AERS: 2.0000 | INHALATION_SPRAY | Freq: Every day | RESPIRATORY_TRACT | 3 refills | Status: DC

## 2020-11-25 NOTE — Telephone Encounter (Signed)
Medication name and strength: Stiolto PA approved/denied: approved Approval dates:  08/27/2020 - 11/25/2021

## 2020-11-26 ENCOUNTER — Other Ambulatory Visit: Payer: Self-pay | Admitting: Cardiovascular Disease

## 2020-12-30 DIAGNOSIS — Z23 Encounter for immunization: Secondary | ICD-10-CM | POA: Diagnosis not present

## 2021-01-01 DIAGNOSIS — H26492 Other secondary cataract, left eye: Secondary | ICD-10-CM | POA: Diagnosis not present

## 2021-01-01 DIAGNOSIS — H40013 Open angle with borderline findings, low risk, bilateral: Secondary | ICD-10-CM | POA: Diagnosis not present
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
## 2021-01-08 ENCOUNTER — Ambulatory Visit (INDEPENDENT_AMBULATORY_CARE_PROVIDER_SITE_OTHER): Payer: Medicare Other | Admitting: Internal Medicine

## 2021-01-08 ENCOUNTER — Other Ambulatory Visit: Payer: Self-pay

## 2021-01-08 ENCOUNTER — Encounter: Payer: Self-pay | Admitting: Internal Medicine

## 2021-01-08 VITALS — BP 126/66 | HR 67 | Ht 67.0 in | Wt 183.8 lb

## 2021-01-08 DIAGNOSIS — I712 Thoracic aortic aneurysm, without rupture, unspecified: Secondary | ICD-10-CM | POA: Insufficient documentation

## 2021-01-08 DIAGNOSIS — I700 Atherosclerosis of aorta: Secondary | ICD-10-CM | POA: Diagnosis not present

## 2021-01-08 DIAGNOSIS — I255 Ischemic cardiomyopathy: Secondary | ICD-10-CM | POA: Diagnosis not present

## 2021-01-08 DIAGNOSIS — I10 Essential (primary) hypertension: Secondary | ICD-10-CM

## 2021-01-08 DIAGNOSIS — I2510 Atherosclerotic heart disease of native coronary artery without angina pectoris: Secondary | ICD-10-CM | POA: Diagnosis not present

## 2021-01-08 DIAGNOSIS — Z9861 Coronary angioplasty status: Secondary | ICD-10-CM

## 2021-01-08 LAB — COMPREHENSIVE METABOLIC PANEL
ALT: 14 IU/L (ref 0–44)
AST: 20 IU/L (ref 0–40)
Albumin/Globulin Ratio: 1.9 (ref 1.2–2.2)
Albumin: 3.9 g/dL (ref 3.7–4.7)
Alkaline Phosphatase: 67 IU/L (ref 44–121)
BUN/Creatinine Ratio: 21 (ref 10–24)
BUN: 18 mg/dL (ref 8–27)
Bilirubin Total: 0.7 mg/dL (ref 0.0–1.2)
CO2: 25 mmol/L (ref 20–29)
Calcium: 9 mg/dL (ref 8.6–10.2)
Chloride: 100 mmol/L (ref 96–106)
Creatinine, Ser: 0.87 mg/dL (ref 0.76–1.27)
Globulin, Total: 2.1 g/dL (ref 1.5–4.5)
Glucose: 93 mg/dL (ref 65–99)
Potassium: 5 mmol/L (ref 3.5–5.2)
Sodium: 137 mmol/L (ref 134–144)
Total Protein: 6 g/dL (ref 6.0–8.5)
eGFR: 88 mL/min/{1.73_m2} (ref >59)

## 2021-01-08 MED ORDER — LOSARTAN POTASSIUM 25 MG PO TABS: 25.0000 mg | ORAL_TABLET | Freq: Every day | ORAL | 3 refills | Status: DC

## 2021-01-08 NOTE — Patient Instructions (Addendum)
Medication Instructions:  Your physician recommends that you continue on your current medications as directed. Please refer to the Current Medication list given to you today. A refill for losartan potassium was sent in to your pharmacy *If you need a refill on your cardiac medications before your next appointment, please call your pharmacy*   Lab Work: TODAY: CMP If you have labs (blood work) drawn today and your tests are completely normal, you will receive your results only by: Marland Kitchen MyChart Message (if you have MyChart) OR . A paper copy in the mail If you have any lab test that is abnormal or we need to change your treatment, we will call you to review the results.   Testing/Procedures: Your Physician has requested that you have a CT of your Aorta in March 2023.   Follow-Up: At [facility], you and your health needs are our priority.  As part of our continuing mission to provide you with exceptional heart care, we have created designated Provider Care Teams.  These Care Teams include your primary Cardiologist (physician) and Advanced Practice Providers (APPs -  Physician Assistants and Nurse Practitioners) who all work together to provide you with the care you need, when you need it.  We recommend signing up for the patient portal called "MyChart".  Sign up information is provided on this After Visit Summary.  MyChart is used to connect with patients for Virtual Visits (Telemedicine).  Patients are able to view lab/test results, encounter notes, upcoming appointments, etc.  Non-urgent messages can be sent to your provider as well.   To learn more about what you can do with MyChart, go to [email or web address].    Your next appointment:   1 year(s)  The format for your next appointment:   In Person  Provider:   You may see Werner Lean, MD or one of the following Advanced Practice Providers on your designated Care Team:    Melina Copa, PA-C  Ermalinda Barrios,  PA-C

## 2021-01-24 DIAGNOSIS — Z20828 Contact with and (suspected) exposure to other viral communicable diseases: Secondary | ICD-10-CM | POA: Diagnosis not present

## 2021-03-03 DIAGNOSIS — Z20822 Contact with and (suspected) exposure to covid-19: Secondary | ICD-10-CM | POA: Diagnosis not present

## 2021-03-08 ENCOUNTER — Other Ambulatory Visit: Payer: Self-pay | Admitting: Cardiovascular Disease

## 2021-04-10 DIAGNOSIS — R609 Edema, unspecified: Secondary | ICD-10-CM | POA: Diagnosis not present

## 2021-04-11 DIAGNOSIS — H10412 Chronic giant papillary conjunctivitis, left eye: Secondary | ICD-10-CM | POA: Diagnosis not present

## 2021-05-18 DIAGNOSIS — Z23 Encounter for immunization: Secondary | ICD-10-CM | POA: Diagnosis not present

## 2021-06-04 DIAGNOSIS — L111 Transient acantholytic dermatosis [Grover]: Secondary | ICD-10-CM | POA: Diagnosis not present

## 2021-06-04 DIAGNOSIS — L812 Freckles: Secondary | ICD-10-CM | POA: Diagnosis not present

## 2021-06-04 DIAGNOSIS — L218 Other seborrheic dermatitis: Secondary | ICD-10-CM | POA: Diagnosis not present

## 2021-06-04 DIAGNOSIS — L821 Other seborrheic keratosis: Secondary | ICD-10-CM | POA: Diagnosis not present

## 2021-06-04 DIAGNOSIS — Z85828 Personal history of other malignant neoplasm of skin: Secondary | ICD-10-CM | POA: Diagnosis not present

## 2021-06-04 DIAGNOSIS — L918 Other hypertrophic disorders of the skin: Secondary | ICD-10-CM | POA: Diagnosis not present

## 2021-06-13 DIAGNOSIS — Z23 Encounter for immunization: Secondary | ICD-10-CM | POA: Diagnosis not present

## 2021-06-25 DIAGNOSIS — Z1389 Encounter for screening for other disorder: Secondary | ICD-10-CM | POA: Diagnosis not present

## 2021-06-25 DIAGNOSIS — K219 Gastro-esophageal reflux disease without esophagitis: Secondary | ICD-10-CM | POA: Diagnosis not present

## 2021-06-25 DIAGNOSIS — J449 Chronic obstructive pulmonary disease, unspecified: Secondary | ICD-10-CM | POA: Diagnosis not present

## 2021-06-25 DIAGNOSIS — Z0000 Encounter for general adult medical examination without abnormal findings: Secondary | ICD-10-CM | POA: Diagnosis not present

## 2021-06-25 DIAGNOSIS — Z8546 Personal history of malignant neoplasm of prostate: Secondary | ICD-10-CM | POA: Diagnosis not present

## 2021-06-25 DIAGNOSIS — E7800 Pure hypercholesterolemia, unspecified: Secondary | ICD-10-CM | POA: Diagnosis not present

## 2021-06-25 DIAGNOSIS — D72110 Idiopathic hypereosinophilic syndrome (ihes): Secondary | ICD-10-CM | POA: Diagnosis not present

## 2021-06-25 DIAGNOSIS — F5101 Primary insomnia: Secondary | ICD-10-CM | POA: Diagnosis not present

## 2021-06-25 DIAGNOSIS — G2581 Restless legs syndrome: Secondary | ICD-10-CM | POA: Diagnosis not present

## 2021-06-25 DIAGNOSIS — I2510 Atherosclerotic heart disease of native coronary artery without angina pectoris: Secondary | ICD-10-CM | POA: Diagnosis not present

## 2021-07-01 DIAGNOSIS — H35372 Puckering of macula, left eye: Secondary | ICD-10-CM | POA: Diagnosis not present

## 2021-07-01 DIAGNOSIS — H31002 Unspecified chorioretinal scars, left eye: Secondary | ICD-10-CM | POA: Diagnosis not present

## 2021-07-01 DIAGNOSIS — H40013 Open angle with borderline findings, low risk, bilateral: Secondary | ICD-10-CM | POA: Diagnosis not present

## 2021-07-01 DIAGNOSIS — H26493 Other secondary cataract, bilateral: Secondary | ICD-10-CM | POA: Diagnosis not present

## 2021-07-03 DIAGNOSIS — Z20822 Contact with and (suspected) exposure to covid-19: Secondary | ICD-10-CM | POA: Diagnosis not present

## 2021-09-15 ENCOUNTER — Encounter: Payer: Self-pay | Admitting: Internal Medicine

## 2021-09-15 DIAGNOSIS — I2510 Atherosclerotic heart disease of native coronary artery without angina pectoris: Secondary | ICD-10-CM

## 2021-09-15 DIAGNOSIS — Z9861 Coronary angioplasty status: Secondary | ICD-10-CM

## 2021-09-15 DIAGNOSIS — I10 Essential (primary) hypertension: Secondary | ICD-10-CM

## 2021-09-15 DIAGNOSIS — D721 Eosinophilia, unspecified: Secondary | ICD-10-CM

## 2021-09-15 DIAGNOSIS — E782 Mixed hyperlipidemia: Secondary | ICD-10-CM

## 2021-09-15 NOTE — Telephone Encounter (Signed)
Pt would like to have labs prior to next ov.  Will route this message to MD to determine what labs are needed.  FLP, ALT, BMP?

## 2021-09-17 MED ORDER — NITROGLYCERIN 0.4 MG SL SUBL: 0.4000 mg | SUBLINGUAL_TABLET | SUBLINGUAL | 3 refills | Status: DC | PRN

## 2021-09-17 MED ORDER — LOSARTAN POTASSIUM 25 MG PO TABS: 25.0000 mg | ORAL_TABLET | Freq: Every day | ORAL | 3 refills | Status: DC

## 2021-09-17 MED ORDER — ATORVASTATIN CALCIUM 40 MG PO TABS: ORAL_TABLET | ORAL | 3 refills | Status: DC

## 2021-09-17 MED ORDER — CARVEDILOL 3.125 MG PO TABS: 3.1250 mg | ORAL_TABLET | Freq: Two times a day (BID) | ORAL | 3 refills | Status: DC

## 2021-09-18 ENCOUNTER — Other Ambulatory Visit: Payer: Self-pay

## 2021-10-29 DIAGNOSIS — H40023 Open angle with borderline findings, high risk, bilateral: Secondary | ICD-10-CM | POA: Diagnosis not present

## 2021-10-29 DIAGNOSIS — H349 Unspecified retinal vascular occlusion: Secondary | ICD-10-CM | POA: Diagnosis not present

## 2021-10-30 ENCOUNTER — Other Ambulatory Visit: Payer: Self-pay

## 2021-10-30 ENCOUNTER — Encounter: Payer: Self-pay | Admitting: Internal Medicine

## 2021-10-30 DIAGNOSIS — I255 Ischemic cardiomyopathy: Secondary | ICD-10-CM

## 2021-10-30 DIAGNOSIS — D721 Eosinophilia, unspecified: Secondary | ICD-10-CM

## 2021-10-30 DIAGNOSIS — I700 Atherosclerosis of aorta: Secondary | ICD-10-CM

## 2021-10-30 DIAGNOSIS — I2510 Atherosclerotic heart disease of native coronary artery without angina pectoris: Secondary | ICD-10-CM

## 2021-10-30 DIAGNOSIS — I10 Essential (primary) hypertension: Secondary | ICD-10-CM

## 2021-10-30 NOTE — Progress Notes (Signed)
CBC changed to CBC with Diff per pt preference sent in through my chart message.  ?

## 2021-11-10 ENCOUNTER — Other Ambulatory Visit: Payer: Self-pay

## 2021-11-10 ENCOUNTER — Other Ambulatory Visit: Payer: Medicare Other | Admitting: *Deleted

## 2021-11-10 DIAGNOSIS — I2510 Atherosclerotic heart disease of native coronary artery without angina pectoris: Secondary | ICD-10-CM

## 2021-11-10 DIAGNOSIS — E782 Mixed hyperlipidemia: Secondary | ICD-10-CM | POA: Diagnosis not present

## 2021-11-10 DIAGNOSIS — D721 Eosinophilia, unspecified: Secondary | ICD-10-CM | POA: Diagnosis not present

## 2021-11-10 DIAGNOSIS — I700 Atherosclerosis of aorta: Secondary | ICD-10-CM | POA: Diagnosis not present

## 2021-11-10 DIAGNOSIS — I10 Essential (primary) hypertension: Secondary | ICD-10-CM | POA: Diagnosis not present

## 2021-11-10 DIAGNOSIS — I255 Ischemic cardiomyopathy: Secondary | ICD-10-CM | POA: Diagnosis not present

## 2021-11-10 LAB — CBC WITH DIFFERENTIAL/PLATELET
Basophils Absolute: 0 10*3/uL (ref 0.0–0.2)
Basos: 0 %
EOS (ABSOLUTE): 4.6 10*3/uL — ABNORMAL HIGH (ref 0.0–0.4)
Eos: 40 %
Hematocrit: 44.3 % (ref 37.5–51.0)
Hemoglobin: 15.4 g/dL (ref 13.0–17.7)
Immature Grans (Abs): 0 10*3/uL (ref 0.0–0.1)
Immature Granulocytes: 0 %
Lymphocytes Absolute: 1.7 10*3/uL (ref 0.7–3.1)
Lymphs: 14 %
MCH: 33.8 pg — ABNORMAL HIGH (ref 26.6–33.0)
MCHC: 34.8 g/dL (ref 31.5–35.7)
MCV: 97 fL (ref 79–97)
Monocytes Absolute: 0.9 10*3/uL (ref 0.1–0.9)
Monocytes: 8 %
Neutrophils Absolute: 4.5 10*3/uL (ref 1.4–7.0)
Neutrophils: 38 %
Platelets: 219 10*3/uL (ref 150–450)
RBC: 4.56 x10E6/uL (ref 4.14–5.80)
RDW: 12.5 % (ref 11.6–15.4)
WBC: 11.7 10*3/uL — ABNORMAL HIGH (ref 3.4–10.8)

## 2021-11-10 LAB — COMPREHENSIVE METABOLIC PANEL
ALT: 15 IU/L (ref 0–44)
AST: 22 IU/L (ref 0–40)
Albumin/Globulin Ratio: 1.7 (ref 1.2–2.2)
Albumin: 4.1 g/dL (ref 3.7–4.7)
Alkaline Phosphatase: 60 IU/L (ref 44–121)
BUN/Creatinine Ratio: 15 (ref 10–24)
BUN: 13 mg/dL (ref 8–27)
Bilirubin Total: 1.1 mg/dL (ref 0.0–1.2)
CO2: 26 mmol/L (ref 20–29)
Calcium: 9.1 mg/dL (ref 8.6–10.2)
Chloride: 102 mmol/L (ref 96–106)
Creatinine, Ser: 0.84 mg/dL (ref 0.76–1.27)
Globulin, Total: 2.4 g/dL (ref 1.5–4.5)
Glucose: 103 mg/dL — ABNORMAL HIGH (ref 70–99)
Potassium: 4.8 mmol/L (ref 3.5–5.2)
Sodium: 138 mmol/L (ref 134–144)
Total Protein: 6.5 g/dL (ref 6.0–8.5)
eGFR: 88 mL/min/{1.73_m2} (ref >59)

## 2021-11-10 LAB — LIPID PANEL
Chol/HDL Ratio: 2.4 ratio (ref 0.0–5.0)
Cholesterol, Total: 169 mg/dL (ref 100–199)
HDL: 69 mg/dL (ref >39)
LDL Chol Calc (NIH): 87 mg/dL (ref 0–99)
Triglycerides: 68 mg/dL (ref 0–149)
VLDL Cholesterol Cal: 13 mg/dL (ref 5–40)

## 2021-11-13 ENCOUNTER — Other Ambulatory Visit: Payer: Self-pay

## 2021-11-13 ENCOUNTER — Ambulatory Visit (HOSPITAL_COMMUNITY)
Admission: RE | Admit: 2021-11-13 | Discharge: 2021-11-13 | Disposition: A | Discharge status: Home / Self Care | Payer: Medicare Other | Source: Ambulatory Visit | Attending: Internal Medicine | Admitting: Internal Medicine

## 2021-11-13 DIAGNOSIS — I712 Thoracic aortic aneurysm, without rupture, unspecified: Secondary | ICD-10-CM | POA: Insufficient documentation

## 2021-11-13 DIAGNOSIS — I2510 Atherosclerotic heart disease of native coronary artery without angina pectoris: Secondary | ICD-10-CM | POA: Diagnosis not present

## 2021-11-13 DIAGNOSIS — I700 Atherosclerosis of aorta: Secondary | ICD-10-CM | POA: Diagnosis not present

## 2021-11-13 MED ORDER — IOHEXOL 350 MG/ML SOLN
60.0000 mL | Freq: Once | INTRAVENOUS | Status: AC | PRN
  Administered 2021-11-13: 60 mL via INTRAVENOUS

## 2021-11-20 DIAGNOSIS — Z20828 Contact with and (suspected) exposure to other viral communicable diseases: Secondary | ICD-10-CM | POA: Diagnosis not present

## 2021-11-21 DIAGNOSIS — Z20822 Contact with and (suspected) exposure to covid-19: Secondary | ICD-10-CM | POA: Diagnosis not present

## 2021-11-23 DIAGNOSIS — Z20822 Contact with and (suspected) exposure to covid-19: Secondary | ICD-10-CM | POA: Diagnosis not present

## 2021-11-26 ENCOUNTER — Other Ambulatory Visit (HOSPITAL_COMMUNITY): Payer: Self-pay

## 2021-12-08 DIAGNOSIS — Z20822 Contact with and (suspected) exposure to covid-19: Secondary | ICD-10-CM | POA: Diagnosis not present

## 2021-12-15 ENCOUNTER — Telehealth: Payer: Self-pay | Admitting: Emergency Medicine

## 2021-12-15 MED ORDER — TIOTROPIUM BROMIDE-OLODATEROL 2.5-2.5 MCG/ACT IN AERS: 2.0000 | INHALATION_SPRAY | Freq: Every day | RESPIRATORY_TRACT | 3 refills | Status: DC

## 2021-12-15 NOTE — Telephone Encounter (Signed)
Called patient and told him that I was sending in his refill of his inhaler to the [facility] on [address]. Patient verbalized understanding with no questions. Nothing further needed  ?

## 2021-12-17 ENCOUNTER — Other Ambulatory Visit: Payer: Medicare Other
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
## 2021-12-22 ENCOUNTER — Ambulatory Visit (INDEPENDENT_AMBULATORY_CARE_PROVIDER_SITE_OTHER): Payer: Medicare Other | Admitting: Internal Medicine

## 2021-12-22 ENCOUNTER — Ambulatory Visit (INDEPENDENT_AMBULATORY_CARE_PROVIDER_SITE_OTHER): Payer: Medicare Other

## 2021-12-22 ENCOUNTER — Encounter: Payer: Self-pay | Admitting: Internal Medicine

## 2021-12-22 VITALS — BP 100/60 | HR 81 | Ht 67.0 in | Wt 185.0 lb

## 2021-12-22 DIAGNOSIS — I700 Atherosclerosis of aorta: Secondary | ICD-10-CM

## 2021-12-22 DIAGNOSIS — I4891 Unspecified atrial fibrillation: Secondary | ICD-10-CM

## 2021-12-22 DIAGNOSIS — I712 Thoracic aortic aneurysm, without rupture, unspecified: Secondary | ICD-10-CM

## 2021-12-22 DIAGNOSIS — I255 Ischemic cardiomyopathy: Secondary | ICD-10-CM

## 2021-12-22 DIAGNOSIS — I2510 Atherosclerotic heart disease of native coronary artery without angina pectoris: Secondary | ICD-10-CM

## 2021-12-22 MED ORDER — APIXABAN 5 MG PO TABS: 5.0000 mg | ORAL_TABLET | Freq: Two times a day (BID) | ORAL | 3 refills | Status: DC

## 2021-12-22 NOTE — Patient Instructions (Signed)
Medication Instructions:  ?Your physician has recommended you make the following change in your medication:  ?STOP: Aspirin ?START: apixaban (Eliquis) 5 mg by mouth twice daily ? ?*If you need a refill on your cardiac medications before your next appointment, please call your pharmacy* ? ? ?Lab Work: ?IN 2 WEEKS: CBC w/ Diff, TSH, BMP ?If you have labs (blood work) drawn today and your tests are completely normal, you will receive your results only by: ?MyChart Message (if you have MyChart) OR ?A paper copy in the mail ?If you have any lab test that is abnormal or we need to change your treatment, we will call you to review the results. ? ? ?Testing/Procedures: ?Your physician has requested that you wear a heart monitor.  ? ? ?Follow-Up: ?At [facility], you and your health needs are our priority.  As part of our continuing mission to provide you with exceptional heart care, we have created designated Provider Care Teams.  These Care Teams include your primary Cardiologist (physician) and Advanced Practice Providers (APPs -  Physician Assistants and Nurse Practitioners) who all work together to provide you with the care you need, when you need it. ? ?Your next appointment:   ?6 month(s) ? ?The format for your next appointment:   ?In Person ? ?Provider:   ?Werner Lean, MD   ? ? ?Other Instructions ?ZIO XT- Long Term Monitor Instructions ? ?Your physician has requested you wear a ZIO patch monitor for 14 days.  ?This is a single patch monitor. [employer] supplies one patch monitor per enrollment. Additional ?stickers are not available. Please do not apply patch if you will be having a Nuclear Stress Test,  ?Echocardiogram, Cardiac CT, MRI, or Chest Xray during the period you would be wearing the  ?monitor. The patch cannot be worn during these tests. You cannot remove and re-apply the  ?ZIO XT patch monitor.  ?Your ZIO patch monitor will be mailed 3 day USPS to your address on file. It may take 3-5 days   ?to receive your monitor after you have been enrolled.  ?Once you have received your monitor, please review the enclosed instructions. Your monitor  ?has already been registered assigning a specific monitor serial # to you. ? ?Billing and Patient Assistance Program Information ? ?We have supplied [employer] with any of your insurance information on file for billing purposes. ?[employer] offers a sliding scale Patient Assistance Program for patients that do not have  ?insurance, or whose insurance does not completely cover the cost of the ZIO monitor.  ?You must apply for the Patient Assistance Program to qualify for this discounted rate.  ?To apply, please call [employer] at [phone], select option 4, select option 2, ask to apply for  ?Patient Assistance Program. Theodore Demark will ask your household income, and how many people  ?are in your household. They will quote your out-of-pocket cost based on that information.  ?[employer] will also be able to set up a 67-month interest-free payment plan if needed. ? ?Applying the monitor ?  ?Shave hair from upper left chest.  ?Hold abrader disc by orange tab. Rub abrader in 40 strokes over the upper left chest as  ?indicated in your monitor instructions.  ?Clean area with 4 enclosed alcohol pads. Let dry.  ?Apply patch as indicated in monitor instructions. Patch will be placed under collarbone on left  ?side of chest with arrow pointing upward.  ?Rub patch adhesive wings for 2 minutes. Remove white label marked "1". Remove the white  ?  label marked "2". Rub patch adhesive wings for 2 additional minutes.  ?While looking in a mirror, press and release button in center of patch. A small green light will  ?flash 3-4 times. This will be your only indicator that the monitor has been turned on.  ?Do not shower for the first 24 hours. You may shower after the first 24 hours.  ?Press the button if you feel a symptom. You will hear a small click. Record Date, Time and  ?Symptom in the Patient  Logbook.  ?When you are ready to remove the patch, follow instructions on the last 2 pages of Patient  ?Logbook. Stick patch monitor onto the last page of Patient Logbook.  ?Place Patient Logbook in the blue and white box. Use locking tab on box and tape box closed  ?securely. The blue and white box has prepaid postage on it. Please place it in the mailbox as  ?soon as possible. Your physician should have your test results approximately 7 days after the  ?monitor has been mailed back to [facility].  ?Call [facility] at [phone] if you have questions regarding  ?your ZIO XT patch monitor. Call them immediately if you see an orange light blinking on your  ?monitor.  ?If your monitor falls off in less than 4 days, contact our Monitor department at [phone].  ?If your monitor becomes loose or falls off after 4 days call [employer] at [phone] for  ?suggestions on securing your monitor  ? ?Important Information About [employer] ? ? ? ? ?  ?

## 2021-12-22 NOTE — Progress Notes (Unsigned)
Enrolled for [employer] to mail a ZIO XT long term holter monitor to the patients address on file.  

## 2021-12-25 DIAGNOSIS — I4891 Unspecified atrial fibrillation: Secondary | ICD-10-CM

## 2021-12-26 DIAGNOSIS — Z20822 Contact with and (suspected) exposure to covid-19: Secondary | ICD-10-CM | POA: Diagnosis not present

## 2021-12-29 DIAGNOSIS — Z20822 Contact with and (suspected) exposure to covid-19: Secondary | ICD-10-CM | POA: Diagnosis not present

## 2022-01-08 ENCOUNTER — Other Ambulatory Visit: Payer: Medicare Other

## 2022-01-08 DIAGNOSIS — I4891 Unspecified atrial fibrillation: Secondary | ICD-10-CM | POA: Diagnosis not present

## 2022-01-08 LAB — CBC WITH DIFFERENTIAL/PLATELET
Basophils Absolute: 0.1 10*3/uL (ref 0.0–0.2)
Basos: 0 %
EOS (ABSOLUTE): 5.4 10*3/uL — ABNORMAL HIGH (ref 0.0–0.4)
Eos: 44 %
Hematocrit: 42.4 % (ref 37.5–51.0)
Hemoglobin: 15 g/dL (ref 13.0–17.7)
Immature Grans (Abs): 0 10*3/uL (ref 0.0–0.1)
Immature Granulocytes: 0 %
Lymphocytes Absolute: 1.5 10*3/uL (ref 0.7–3.1)
Lymphs: 13 %
MCH: 33.6 pg — ABNORMAL HIGH (ref 26.6–33.0)
MCHC: 35.4 g/dL (ref 31.5–35.7)
MCV: 95 fL (ref 79–97)
Monocytes Absolute: 1 10*3/uL — ABNORMAL HIGH (ref 0.1–0.9)
Monocytes: 8 %
Neutrophils Absolute: 4.2 10*3/uL (ref 1.4–7.0)
Neutrophils: 35 %
Platelets: 276 10*3/uL (ref 150–450)
RBC: 4.47 x10E6/uL (ref 4.14–5.80)
RDW: 12.6 % (ref 11.6–15.4)
WBC: 12.1 10*3/uL — ABNORMAL HIGH (ref 3.4–10.8)

## 2022-01-08 LAB — BASIC METABOLIC PANEL
BUN/Creatinine Ratio: 19 (ref 10–24)
BUN: 16 mg/dL (ref 8–27)
CO2: 25 mmol/L (ref 20–29)
Calcium: 8.8 mg/dL (ref 8.6–10.2)
Chloride: 94 mmol/L — ABNORMAL LOW (ref 96–106)
Creatinine, Ser: 0.85 mg/dL (ref 0.76–1.27)
Glucose: 103 mg/dL — ABNORMAL HIGH (ref 70–99)
Potassium: 4.5 mmol/L (ref 3.5–5.2)
Sodium: 130 mmol/L — ABNORMAL LOW (ref 134–144)
eGFR: 88 mL/min/{1.73_m2} (ref >59)

## 2022-01-08 LAB — TSH: TSH: 4.59 u[IU]/mL — ABNORMAL HIGH (ref 0.450–4.500)

## 2022-01-12 ENCOUNTER — Telehealth: Payer: Self-pay

## 2022-01-12 DIAGNOSIS — R7989 Other specified abnormal findings of blood chemistry: Secondary | ICD-10-CM

## 2022-01-12 DIAGNOSIS — I4891 Unspecified atrial fibrillation: Secondary | ICD-10-CM

## 2022-01-12 DIAGNOSIS — R799 Abnormal finding of blood chemistry, unspecified: Secondary | ICD-10-CM

## 2022-01-12 NOTE — Telephone Encounter (Signed)
-----   Message from Werner Lean, MD sent at 01/09/2022  4:37 PM EDT ----- Results: TSH slightly increased WBC slightly increased, but similar to prior EOS count slightly increased but similar to prior BMP similar to prior  Hgb stable Plan: Continue eliquis  Free T3, T4; and send labs to primary  Werner Lean, MD

## 2022-01-12 NOTE — Telephone Encounter (Signed)
The patient has been notified of the result and verbalized understanding.  All questions (if any) were answered. Precious Gilding, RN 01/12/2022 11:52 AM   Labs scheduled for 01/16/22.

## 2022-01-14 DIAGNOSIS — I4891 Unspecified atrial fibrillation: Secondary | ICD-10-CM | POA: Diagnosis not present

## 2022-01-16 ENCOUNTER — Other Ambulatory Visit: Payer: Medicare Other | Admitting: *Deleted

## 2022-01-16 DIAGNOSIS — R7989 Other specified abnormal findings of blood chemistry: Secondary | ICD-10-CM

## 2022-01-16 DIAGNOSIS — R799 Abnormal finding of blood chemistry, unspecified: Secondary | ICD-10-CM

## 2022-01-16 DIAGNOSIS — I4891 Unspecified atrial fibrillation: Secondary | ICD-10-CM

## 2022-01-17 LAB — T3, FREE: T3, Free: 2.9 pg/mL (ref 2.0–4.4)

## 2022-01-17 LAB — T4, FREE: Free T4: 1.02 ng/dL (ref 0.82–1.77)

## 2022-01-19 ENCOUNTER — Encounter: Payer: Self-pay | Admitting: Internal Medicine

## 2022-01-19 DIAGNOSIS — I4891 Unspecified atrial fibrillation: Secondary | ICD-10-CM

## 2022-01-20 NOTE — Telephone Encounter (Signed)
Referral placed for EP d/t Afib and heart monitor results per Gasper Sells, MD.

## 2022-03-12 DIAGNOSIS — H04123 Dry eye syndrome of bilateral lacrimal glands: Secondary | ICD-10-CM | POA: Diagnosis not present

## 2022-03-12 DIAGNOSIS — H10503 Unspecified blepharoconjunctivitis, bilateral: Secondary | ICD-10-CM | POA: Diagnosis not present

## 2022-03-27 ENCOUNTER — Encounter: Payer: Self-pay | Admitting: Cardiology
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
## 2022-03-30 ENCOUNTER — Ambulatory Visit (INDEPENDENT_AMBULATORY_CARE_PROVIDER_SITE_OTHER): Payer: Medicare Other | Admitting: Cardiology

## 2022-03-30 ENCOUNTER — Encounter: Payer: Self-pay | Admitting: Cardiology

## 2022-03-30 VITALS — BP 128/76 | HR 64 | Ht 67.0 in | Wt 191.0 lb

## 2022-03-30 DIAGNOSIS — I10 Essential (primary) hypertension: Secondary | ICD-10-CM

## 2022-03-30 DIAGNOSIS — I712 Thoracic aortic aneurysm, without rupture, unspecified: Secondary | ICD-10-CM

## 2022-03-30 DIAGNOSIS — I700 Atherosclerosis of aorta: Secondary | ICD-10-CM

## 2022-03-30 DIAGNOSIS — I480 Paroxysmal atrial fibrillation: Secondary | ICD-10-CM

## 2022-03-30 NOTE — Patient Instructions (Signed)
Medication Instructions:  none *If you need a refill on your cardiac medications before your next appointment, please call your pharmacy*   Lab Work: none If you have labs (blood work) drawn today and your tests are completely normal, you will receive your results only by: [place] (if you have MyChart) OR A paper copy in the mail If you have any lab test that is abnormal or we need to change your treatment, we will call you to review the results.   Testing/Procedures: none   Follow-Up: At [facility], you and your health needs are our priority.  As part of our continuing mission to provide you with exceptional heart care, we have created designated Provider Care Teams.  These Care Teams include your primary Cardiologist (physician) and Advanced Practice Providers (APPs -  Physician Assistants and Nurse Practitioners) who all work together to provide you with the care you need, when you need it.  We recommend signing up for the patient portal called "MyChart".  Sign up information is provided on this After Visit Summary.  MyChart is used to connect with patients for Virtual Visits (Telemedicine).  Patients are able to view lab/test results, encounter notes, upcoming appointments, etc.  Non-urgent messages can be sent to your provider as well.   To learn more about what you can do with MyChart, go to [email or web address].    Your next appointment:   Please call the office if you wish to move forward with the Tikosyn or Ablation.    Other Instructions Cardiac Ablation Cardiac ablation is a procedure to destroy (ablate) some heart tissue that is sending bad signals. These bad signals cause problems in heart rhythm. The heart has many areas that make these signals. If there are problems in these areas, they can make the heart beat in a way that is not normal. Destroying some tissues can help make the heart rhythm normal. Tell your doctor about: Any allergies you have. All  medicines you are taking. These include vitamins, herbs, eye drops, creams, and over-the-counter medicines. Any problems you or family members have had with medicines that make you fall asleep (anesthetics). Any blood disorders you have. Any surgeries you have had. Any medical conditions you have, such as kidney failure. Whether you are pregnant or may be pregnant. What are the risks? This is a safe procedure. But problems may occur, including: Infection. Bruising and bleeding. Bleeding into the chest. Stroke or blood clots. Damage to nearby areas of your body. Allergies to medicines or dyes. The need for a pacemaker if the normal system is damaged. Failure of the procedure to treat the problem. What happens before the procedure? Medicines Ask your doctor about: Changing or stopping your normal medicines. This is important. Taking aspirin and ibuprofen. Do not take these medicines unless your doctor tells you to take them. Taking other medicines, vitamins, herbs, and supplements. General instructions Follow instructions from your doctor about what you cannot eat or drink. Plan to have someone take you home from the hospital or clinic. If you will be going home right after the procedure, plan to have someone with you for 24 hours. Ask your doctor what steps will be taken to prevent infection. What happens during the procedure?  An IV tube will be put into one of your veins. You will be given a medicine to help you relax. The skin on your neck or groin will be numbed. A cut (incision) will be made in your neck or groin. A needle will  be put through your cut and into a large vein. A tube (catheter) will be put into the needle. The tube will be moved to your heart. Dye may be put through the tube. This helps your doctor see your heart. Small devices (electrodes) on the tube will send out signals. A type of energy will be used to destroy some heart tissue. The tube will be taken  out. Pressure will be held on your cut. This helps stop bleeding. A bandage will be put over your cut. The exact procedure may vary among doctors and hospitals. What happens after the procedure? You will be watched until you leave the hospital or clinic. This includes checking your heart rate, breathing rate, oxygen, and blood pressure. Your cut will be watched for bleeding. You will need to lie still for a few hours. Do not drive for 24 hours or as long as your doctor tells you. Summary Cardiac ablation is a procedure to destroy some heart tissue. This is done to treat heart rhythm problems. Tell your doctor about any medical conditions you may have. Tell him or her about all medicines you are taking to treat them. This is a safe procedure. But problems may occur. These include infection, bruising, bleeding, and damage to nearby areas of your body. Follow what your doctor tells you about food and drink. You may also be told to change or stop some of your medicines. After the procedure, do not drive for 24 hours or as long as your doctor tells you. This information is not intended to replace advice given to you by your health care provider. Make sure you discuss any questions you have with your health care provider. Document Revised: 10/31/2021 Document Reviewed: 07/13/2019 Elsevier Patient Education  [place] (Dofetilide) Hospital Admission   Prior to day of admission:  Check with drug insurance company for cost of drug to ensure affordability --- Dofetilide 500 mcg twice a day.  GoodRx is an option if insurance copay is unaffordable.   All patients are tested for COVID-19 prior to admission.   No Benadryl is allowed 3 days prior to admission.   Please ensure no missed doses of your anticoagulation (blood thinner) for 3 weeks prior to admission. If a dose is missed please notify our office immediately.   A pharmacist will review all your medications for potential  interactions with Tikosyn. If any medication changes are needed prior to admission we will be in touch with you.   If any new medications are started AFTER your admission date is set with Nurse, adult. Please notify our office immediately so your medication list can be updated and reviewed by our pharmacist again.  On day of admission:  Tikosyn initiation requires a 3 night/4 day hospital stay with constant telemetry monitoring. You will have an EKG after each dose of Tikosyn as well as daily lab draws.   If the drug does not convert you to normal rhythm a cardioversion after the 4th dose of Tikosyn.   Afib Clinic office visit on the morning of admission is needed for preliminary labs/ekg.   Time of admission is dependent on bed availability in the hospital. In some instances, you will be sent home until bed is available. Rarely admission can be delayed to the following day if hospital census prevents available beds.   You may bring personal belongings/clothing with you to the hospital. Please leave your suitcase in the car until you arrive in admissions.   Questions please  call our office at [place]
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
## 2022-04-16 ENCOUNTER — Encounter: Payer: Self-pay | Admitting: Cardiology

## 2022-04-29 ENCOUNTER — Encounter: Payer: Self-pay | Admitting: Cardiology

## 2022-04-29 DIAGNOSIS — Z01818 Encounter for other preprocedural examination: Secondary | ICD-10-CM

## 2022-04-29 DIAGNOSIS — I480 Paroxysmal atrial fibrillation: Secondary | ICD-10-CM

## 2022-05-13 DIAGNOSIS — H40023 Open angle with borderline findings, high risk, bilateral: Secondary | ICD-10-CM | POA: Diagnosis not present

## 2022-06-03 DIAGNOSIS — Z85828 Personal history of other malignant neoplasm of skin: Secondary | ICD-10-CM | POA: Diagnosis not present

## 2022-06-03 DIAGNOSIS — L812 Freckles: Secondary | ICD-10-CM | POA: Diagnosis not present

## 2022-06-03 DIAGNOSIS — C4441 Basal cell carcinoma of skin of scalp and neck: Secondary | ICD-10-CM | POA: Diagnosis not present

## 2022-06-03 DIAGNOSIS — L570 Actinic keratosis: Secondary | ICD-10-CM | POA: Diagnosis not present

## 2022-06-03 DIAGNOSIS — L821 Other seborrheic keratosis: Secondary | ICD-10-CM | POA: Diagnosis not present

## 2022-06-03 DIAGNOSIS — H40023 Open angle with borderline findings, high risk, bilateral: Secondary | ICD-10-CM | POA: Diagnosis not present

## 2022-06-03 DIAGNOSIS — D485 Neoplasm of uncertain behavior of skin: Secondary | ICD-10-CM | POA: Diagnosis not present

## 2022-06-12 DIAGNOSIS — Z23 Encounter for immunization: Secondary | ICD-10-CM | POA: Diagnosis not present

## 2022-06-16 ENCOUNTER — Encounter: Payer: Self-pay | Admitting: Internal Medicine

## 2022-06-24 ENCOUNTER — Ambulatory Visit: Payer: Medicare Other | Admitting: Internal Medicine

## 2022-07-01 DIAGNOSIS — F5101 Primary insomnia: Secondary | ICD-10-CM | POA: Diagnosis not present

## 2022-07-01 DIAGNOSIS — I700 Atherosclerosis of aorta: Secondary | ICD-10-CM | POA: Diagnosis not present

## 2022-07-01 DIAGNOSIS — Z8546 Personal history of malignant neoplasm of prostate: Secondary | ICD-10-CM | POA: Diagnosis not present

## 2022-07-01 DIAGNOSIS — G2581 Restless legs syndrome: Secondary | ICD-10-CM | POA: Diagnosis not present

## 2022-07-01 DIAGNOSIS — J449 Chronic obstructive pulmonary disease, unspecified: Secondary | ICD-10-CM | POA: Diagnosis not present

## 2022-07-01 DIAGNOSIS — K219 Gastro-esophageal reflux disease without esophagitis: Secondary | ICD-10-CM | POA: Diagnosis not present

## 2022-07-01 DIAGNOSIS — Z79899 Other long term (current) drug therapy: Secondary | ICD-10-CM | POA: Diagnosis not present

## 2022-07-01 DIAGNOSIS — Z1331 Encounter for screening for depression: Secondary | ICD-10-CM | POA: Diagnosis not present

## 2022-07-01 DIAGNOSIS — Z0000 Encounter for general adult medical examination without abnormal findings: Secondary | ICD-10-CM | POA: Diagnosis not present

## 2022-07-01 DIAGNOSIS — I712 Thoracic aortic aneurysm, without rupture, unspecified: Secondary | ICD-10-CM | POA: Diagnosis not present

## 2022-07-01 DIAGNOSIS — Z23 Encounter for immunization: Secondary | ICD-10-CM | POA: Diagnosis not present

## 2022-07-01 DIAGNOSIS — E7800 Pure hypercholesterolemia, unspecified: Secondary | ICD-10-CM | POA: Diagnosis not present

## 2022-07-01 DIAGNOSIS — I480 Paroxysmal atrial fibrillation: Secondary | ICD-10-CM | POA: Diagnosis not present

## 2022-07-01 DIAGNOSIS — I2510 Atherosclerotic heart disease of native coronary artery without angina pectoris: Secondary | ICD-10-CM | POA: Diagnosis not present

## 2022-07-04 DIAGNOSIS — Z23 Encounter for immunization: Secondary | ICD-10-CM | POA: Diagnosis not present

## 2022-07-05 ENCOUNTER — Encounter: Payer: Self-pay | Admitting: Cardiology

## 2022-07-05 ENCOUNTER — Encounter: Payer: Self-pay | Admitting: Internal Medicine

## 2022-07-07 ENCOUNTER — Other Ambulatory Visit: Payer: Medicare Other

## 2022-07-07 NOTE — Telephone Encounter (Signed)
Called [facility] request to have labs resent to our office.

## 2022-07-08 ENCOUNTER — Encounter: Payer: Self-pay | Admitting: Internal Medicine

## 2022-07-08 MED ORDER — LOSARTAN POTASSIUM 25 MG PO TABS: 25.0000 mg | ORAL_TABLET | Freq: Every day | ORAL | 0 refills | Status: DC

## 2022-07-08 MED ORDER — CARVEDILOL 3.125 MG PO TABS: 3.1250 mg | ORAL_TABLET | Freq: Two times a day (BID) | ORAL | 0 refills | Status: DC

## 2022-07-17 ENCOUNTER — Telehealth (HOSPITAL_COMMUNITY): Payer: Self-pay | Admitting: *Deleted

## 2022-07-17 NOTE — Telephone Encounter (Signed)
Attempted to call patient regarding upcoming cardiac CT appointment. °Left message on voicemail with name and callback number ° °Reid Nawrot RN Navigator Cardiac Imaging °[facility] °[phone] Office °[phone] Cell ° °

## 2022-07-20 ENCOUNTER — Ambulatory Visit (HOSPITAL_BASED_OUTPATIENT_CLINIC_OR_DEPARTMENT_OTHER)
Admission: RE | Admit: 2022-07-20 | Discharge: 2022-07-20 | Disposition: A | Discharge status: Home / Self Care | Payer: Medicare Other | Source: Ambulatory Visit | Attending: Cardiology | Admitting: Cardiology

## 2022-07-20 ENCOUNTER — Encounter (HOSPITAL_BASED_OUTPATIENT_CLINIC_OR_DEPARTMENT_OTHER): Payer: Self-pay

## 2022-07-20 DIAGNOSIS — Z01818 Encounter for other preprocedural examination: Secondary | ICD-10-CM | POA: Insufficient documentation

## 2022-07-20 DIAGNOSIS — I480 Paroxysmal atrial fibrillation: Secondary | ICD-10-CM | POA: Insufficient documentation

## 2022-07-20 MED ORDER — IOHEXOL 350 MG/ML SOLN
100.0000 mL | Freq: Once | INTRAVENOUS | Status: AC | PRN
  Administered 2022-07-20: 80 mL via INTRAVENOUS

## 2022-07-24 NOTE — Pre-Procedure Instructions (Signed)
Instructed patient on the following items: Arrival time 0530 Nothing to eat or drink after midnight No meds AM of procedure Responsible person to drive you home and stay with you for 24 hrs  Have you missed any doses of anti-coagulant Eliquis- hasn't missed any doses    

## 2022-07-27 ENCOUNTER — Ambulatory Visit (HOSPITAL_COMMUNITY)
Admission: RE | Admit: 2022-07-27 | Discharge: 2022-07-27 | Disposition: A | Discharge status: Home / Self Care | Payer: Medicare Other | Attending: Cardiology | Admitting: Cardiology

## 2022-07-27 ENCOUNTER — Other Ambulatory Visit (HOSPITAL_COMMUNITY): Payer: Self-pay

## 2022-07-27 ENCOUNTER — Ambulatory Visit (HOSPITAL_COMMUNITY): Payer: Medicare Other | Admitting: Certified Registered Nurse Anesthetist

## 2022-07-27 ENCOUNTER — Encounter (HOSPITAL_COMMUNITY)
Admission: RE | Disposition: A | Discharge status: Home / Self Care | Payer: Self-pay | Source: Home / Self Care | Attending: Cardiology

## 2022-07-27 ENCOUNTER — Other Ambulatory Visit: Payer: Self-pay

## 2022-07-27 ENCOUNTER — Ambulatory Visit (HOSPITAL_BASED_OUTPATIENT_CLINIC_OR_DEPARTMENT_OTHER): Payer: Medicare Other | Admitting: Certified Registered Nurse Anesthetist

## 2022-07-27 DIAGNOSIS — Z87891 Personal history of nicotine dependence: Secondary | ICD-10-CM

## 2022-07-27 DIAGNOSIS — Z955 Presence of coronary angioplasty implant and graft: Secondary | ICD-10-CM | POA: Insufficient documentation

## 2022-07-27 DIAGNOSIS — I110 Hypertensive heart disease with heart failure: Secondary | ICD-10-CM | POA: Insufficient documentation

## 2022-07-27 DIAGNOSIS — I5022 Chronic systolic (congestive) heart failure: Secondary | ICD-10-CM | POA: Insufficient documentation

## 2022-07-27 DIAGNOSIS — I4891 Unspecified atrial fibrillation: Secondary | ICD-10-CM | POA: Diagnosis not present

## 2022-07-27 DIAGNOSIS — Z7901 Long term (current) use of anticoagulants: Secondary | ICD-10-CM | POA: Insufficient documentation

## 2022-07-27 DIAGNOSIS — J449 Chronic obstructive pulmonary disease, unspecified: Secondary | ICD-10-CM | POA: Insufficient documentation

## 2022-07-27 DIAGNOSIS — I2510 Atherosclerotic heart disease of native coronary artery without angina pectoris: Secondary | ICD-10-CM | POA: Diagnosis not present

## 2022-07-27 DIAGNOSIS — I700 Atherosclerosis of aorta: Secondary | ICD-10-CM | POA: Diagnosis not present

## 2022-07-27 DIAGNOSIS — I252 Old myocardial infarction: Secondary | ICD-10-CM

## 2022-07-27 DIAGNOSIS — I10 Essential (primary) hypertension: Secondary | ICD-10-CM

## 2022-07-27 DIAGNOSIS — E785 Hyperlipidemia, unspecified: Secondary | ICD-10-CM | POA: Diagnosis not present

## 2022-07-27 DIAGNOSIS — I480 Paroxysmal atrial fibrillation: Secondary | ICD-10-CM | POA: Insufficient documentation

## 2022-07-27 HISTORY — PX: ATRIAL FIBRILLATION ABLATION: EP1191

## 2022-07-27 LAB — POCT ACTIVATED CLOTTING TIME: Activated Clotting Time: 293 seconds

## 2022-07-27 SURGERY — ATRIAL FIBRILLATION ABLATION
Anesthesia: General

## 2022-07-27 MED ORDER — ACETAMINOPHEN 325 MG PO TABS: 650.0000 mg | ORAL_TABLET | ORAL | Status: DC | PRN

## 2022-07-27 MED ORDER — ROCURONIUM BROMIDE 10 MG/ML (PF) SYRINGE
PREFILLED_SYRINGE | INTRAVENOUS | Status: DC | PRN
  Administered 2022-07-27 (×2): 50 mg via INTRAVENOUS

## 2022-07-27 MED ORDER — SODIUM CHLORIDE 0.9 % IV SOLN: 250.0000 mL | INTRAVENOUS | Status: DC | PRN

## 2022-07-27 MED ORDER — PROPOFOL 10 MG/ML IV BOLUS
INTRAVENOUS | Status: DC | PRN
  Administered 2022-07-27: 30 mg via INTRAVENOUS
  Administered 2022-07-27: 120 mg via INTRAVENOUS

## 2022-07-27 MED ORDER — COLCHICINE 0.6 MG PO TABS
0.6000 mg | ORAL_TABLET | Freq: Two times a day (BID) | ORAL | 0 refills | Status: DC
  Filled 2022-07-27: qty 10, 5d supply, fill #0

## 2022-07-27 MED ORDER — SODIUM CHLORIDE 0.9% FLUSH: 3.0000 mL | Freq: Two times a day (BID) | INTRAVENOUS | Status: DC

## 2022-07-27 MED ORDER — SODIUM CHLORIDE 0.9 % IV SOLN: INTRAVENOUS | Status: DC

## 2022-07-27 MED ORDER — HEPARIN (PORCINE) IN NACL 1000-0.9 UT/500ML-% IV SOLN
INTRAVENOUS | Status: DC | PRN
  Administered 2022-07-27 (×2): 500 mL

## 2022-07-27 MED ORDER — HEPARIN SODIUM (PORCINE) 1000 UNIT/ML IJ SOLN
INTRAMUSCULAR | Status: DC | PRN
  Administered 2022-07-27: 13000 [IU] via INTRAVENOUS
  Administered 2022-07-27: 4000 [IU] via INTRAVENOUS

## 2022-07-27 MED ORDER — PANTOPRAZOLE SODIUM 40 MG PO TBEC
40.0000 mg | DELAYED_RELEASE_TABLET | Freq: Every day | ORAL | Status: DC
  Administered 2022-07-27: 40 mg via ORAL
  Filled 2022-07-27: qty 1

## 2022-07-27 MED ORDER — ESOMEPRAZOLE MAGNESIUM 40 MG PO CPDR: 40.0000 mg | DELAYED_RELEASE_CAPSULE | Freq: Every day | ORAL | Status: AC | PRN

## 2022-07-27 MED ORDER — DEXAMETHASONE SODIUM PHOSPHATE 10 MG/ML IJ SOLN
INTRAMUSCULAR | Status: DC | PRN
  Administered 2022-07-27: 5 mg via INTRAVENOUS

## 2022-07-27 MED ORDER — HEPARIN SODIUM (PORCINE) 1000 UNIT/ML IJ SOLN
INTRAMUSCULAR | Status: AC
  Filled 2022-07-27: qty 10

## 2022-07-27 MED ORDER — APIXABAN 5 MG PO TABS
5.0000 mg | ORAL_TABLET | Freq: Two times a day (BID) | ORAL | Status: DC
  Administered 2022-07-27: 5 mg via ORAL
  Filled 2022-07-27: qty 1

## 2022-07-27 MED ORDER — HEPARIN SODIUM (PORCINE) 1000 UNIT/ML IJ SOLN
INTRAMUSCULAR | Status: DC | PRN
  Administered 2022-07-27: 1000 [IU] via INTRAVENOUS

## 2022-07-27 MED ORDER — SUGAMMADEX SODIUM 200 MG/2ML IV SOLN
INTRAVENOUS | Status: DC | PRN
  Administered 2022-07-27: 200 mg via INTRAVENOUS

## 2022-07-27 MED ORDER — PROTAMINE SULFATE 10 MG/ML IV SOLN
INTRAVENOUS | Status: DC | PRN
  Administered 2022-07-27: 30 mg via INTRAVENOUS

## 2022-07-27 MED ORDER — PANTOPRAZOLE SODIUM 40 MG PO TBEC
40.0000 mg | DELAYED_RELEASE_TABLET | Freq: Every day | ORAL | 0 refills | Status: AC
  Filled 2022-07-27: qty 45, 45d supply, fill #0

## 2022-07-27 MED ORDER — SODIUM CHLORIDE 0.9% FLUSH: 3.0000 mL | INTRAVENOUS | Status: DC | PRN

## 2022-07-27 MED ORDER — PHENYLEPHRINE 80 MCG/ML (10ML) SYRINGE FOR IV PUSH (FOR BLOOD PRESSURE SUPPORT)
PREFILLED_SYRINGE | INTRAVENOUS | Status: DC | PRN
  Administered 2022-07-27 (×2): 80 ug via INTRAVENOUS

## 2022-07-27 MED ORDER — LIDOCAINE 2% (20 MG/ML) 5 ML SYRINGE
INTRAMUSCULAR | Status: DC | PRN
  Administered 2022-07-27: 60 mg via INTRAVENOUS

## 2022-07-27 MED ORDER — FENTANYL CITRATE (PF) 100 MCG/2ML IJ SOLN
INTRAMUSCULAR | Status: DC | PRN
Start: 2022-07-27 — End: 2022-07-27
  Administered 2022-07-27 (×2): 50 ug via INTRAVENOUS

## 2022-07-27 MED ORDER — ONDANSETRON HCL 4 MG/2ML IJ SOLN
INTRAMUSCULAR | Status: DC | PRN
  Administered 2022-07-27: 4 mg via INTRAVENOUS

## 2022-07-27 MED ORDER — ONDANSETRON HCL 4 MG/2ML IJ SOLN: 4.0000 mg | Freq: Four times a day (QID) | INTRAMUSCULAR | Status: DC | PRN

## 2022-07-27 MED ORDER — PHENYLEPHRINE HCL-NACL 20-0.9 MG/250ML-% IV SOLN
INTRAVENOUS | Status: DC | PRN
  Administered 2022-07-27: 15 ug/min via INTRAVENOUS

## 2022-07-27 SURGICAL SUPPLY — 17 items
CATH 8FR REPROCESSED SOUNDSTAR (CATHETERS) ×1 IMPLANT
CATH 8FR SOUNDSTAR REPROCESSED (CATHETERS) IMPLANT
CATH ABLAT QDOT MICRO BI TC DF (CATHETERS) IMPLANT
CATH OCTARAY 2.0 F 3-3-3-3-3 (CATHETERS) IMPLANT
CATH S-M CIRCA TEMP PROBE (CATHETERS) IMPLANT
CATH WEB BI DIR CSDF CRV REPRO (CATHETERS) IMPLANT
CLOSURE PERCLOSE PROSTYLE (VASCULAR PRODUCTS) IMPLANT
PACK EP LATEX FREE (CUSTOM PROCEDURE TRAY) ×1
PACK EP LF (CUSTOM PROCEDURE TRAY) ×1 IMPLANT
PAD DEFIB RADIO PHYSIO CONN (PAD) ×1 IMPLANT
PATCH CARTO3 (PAD) IMPLANT
SHEATH BAYLIS TRANSSEPTAL 98CM (NEEDLE) IMPLANT
SHEATH CARTO VIZIGO SM CVD (SHEATH) IMPLANT
SHEATH PINNACLE 8F 10CM (SHEATH) IMPLANT
SHEATH PINNACLE 9F 10CM (SHEATH) IMPLANT
SHEATH PROBE COVER 6X72 (BAG) IMPLANT
TUBING SMART ABLATE COOLFLOW (TUBING) IMPLANT

## 2022-07-27 NOTE — Anesthesia Preprocedure Evaluation (Signed)
Anesthesia Evaluation  Patient identified by MRN, date of birth, ID band Patient awake    Reviewed: Allergy & Precautions, H&P , NPO status , Patient's Chart, lab work & pertinent test results  Airway Mallampati: II   Neck ROM: full    Dental   Pulmonary COPD, former smoker   breath sounds clear to auscultation       Cardiovascular hypertension, + CAD, + Past MI and + Cardiac Stents  + dysrhythmias Atrial Fibrillation  Rhythm:regular Rate:Normal     Neuro/Psych  Neuromuscular disease    GI/Hepatic ,GERD  ,,  Endo/Other    Renal/GU      Musculoskeletal   Abdominal   Peds  Hematology   Anesthesia Other Findings   Reproductive/Obstetrics                             Anesthesia Physical Anesthesia Plan  ASA: 3  Anesthesia Plan: General   Post-op Pain Management:    Induction: Intravenous  PONV Risk Score and Plan: 2 and Ondansetron, Dexamethasone and Treatment may vary due to age or medical condition  Airway Management Planned: Oral ETT  Additional Equipment:   Intra-op Plan:   Post-operative Plan: Extubation in OR  Informed Consent: I have reviewed the patients History and Physical, chart, labs and discussed the procedure including the risks, benefits and alternatives for the proposed anesthesia with the patient or authorized representative who has indicated his/her understanding and acceptance.     Dental advisory given  Plan Discussed with: CRNA, Anesthesiologist and Surgeon  Anesthesia Plan Comments:        Anesthesia Quick Evaluation

## 2022-07-27 NOTE — Discharge Instructions (Signed)

## 2022-07-27 NOTE — Anesthesia Procedure Notes (Signed)
Procedure Name: Intubation Date/Time: 07/27/2022 7:49 AM  Performed by: Valda Favia, CRNAPre-anesthesia Checklist: Patient identified, Emergency Drugs available, Suction available and Patient being monitored Patient Re-evaluated:Patient Re-evaluated prior to induction Oxygen Delivery Method: Circle System Utilized Preoxygenation: Pre-oxygenation with 100% oxygen Induction Type: IV induction Ventilation: Mask ventilation without difficulty Laryngoscope Size: Mac and 4 Grade View: Grade I Tube type: Oral Tube size: 7.5 mm Number of attempts: 1 Airway Equipment and Method: Stylet Placement Confirmation: ETT inserted through vocal cords under direct vision, positive ETCO2 and breath sounds checked- equal and bilateral Secured at: 21 cm Tube secured with: Tape Dental Injury: Teeth and Oropharynx as per pre-operative assessment

## 2022-07-27 NOTE — Transfer of Care (Signed)
Immediate Anesthesia Transfer of Care Note  Patient: Jay Wilkins  Procedure(s) Performed: ATRIAL FIBRILLATION ABLATION  Patient Location: Cath Lab  Anesthesia Type:General  Level of Consciousness: awake, alert , and oriented  Airway & Oxygen Therapy: Patient Spontanous Breathing and Patient connected to nasal cannula oxygen  Post-op Assessment: Report given to RN and Post -op Vital signs reviewed and stable  Post vital signs: Reviewed and stable  Last Vitals:  Vitals Value Taken Time  BP 132/58 07/27/22 0932  Temp    Pulse 70 07/27/22 0933  Resp 11 07/27/22 0933  SpO2 97 % 07/27/22 0933  Vitals shown include unvalidated device data.  Last Pain:  Vitals:   07/27/22 0932  TempSrc:   PainSc: 0-No pain         Complications: No notable events documented.
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
## 2022-07-28 ENCOUNTER — Encounter (HOSPITAL_COMMUNITY): Payer: Self-pay | Admitting: Cardiology

## 2022-07-28 NOTE — Anesthesia Postprocedure Evaluation (Signed)
Anesthesia Post Note  Patient: Jay Wilkins  Procedure(s) Performed: ATRIAL FIBRILLATION ABLATION     Patient location during evaluation: PACU Anesthesia Type: General Level of consciousness: awake and alert Pain management: pain level controlled Vital Signs Assessment: post-procedure vital signs reviewed and stable Respiratory status: spontaneous breathing, nonlabored ventilation, respiratory function stable and patient connected to nasal cannula oxygen Cardiovascular status: blood pressure returned to baseline and stable Postop Assessment: no apparent nausea or vomiting Anesthetic complications: no   No notable events documented.  Last Vitals:  Vitals:   07/27/22 1030 07/27/22 1200  BP: 127/72 (!) 143/71  Pulse: 66 69  Resp: (!) 9 18  Temp:    SpO2: 95% 95%    Last Pain:  Vitals:   07/27/22 1005  TempSrc:   PainSc: 0-No pain                 August Longest S

## 2022-08-03 ENCOUNTER — Other Ambulatory Visit: Payer: Self-pay

## 2022-08-03 MED ORDER — APIXABAN 5 MG PO TABS: 5.0000 mg | ORAL_TABLET | Freq: Two times a day (BID) | ORAL | 3 refills | Status: DC

## 2022-08-03 NOTE — Telephone Encounter (Signed)
Prescription refill request for Eliquis received. Indication:afib Last office visit:8/23 Scr:0.8 Age: 81 Weight:83.9  kg  Prescription refilled

## 2022-08-25 ENCOUNTER — Encounter (HOSPITAL_COMMUNITY): Payer: Self-pay | Admitting: Nurse Practitioner

## 2022-08-25 ENCOUNTER — Ambulatory Visit (HOSPITAL_COMMUNITY)
Admission: RE | Admit: 2022-08-25 | Discharge: 2022-08-25 | Disposition: A | Discharge status: Home / Self Care | Payer: Medicare Other | Source: Ambulatory Visit | Attending: Nurse Practitioner | Admitting: Nurse Practitioner

## 2022-08-25 VITALS — BP 138/68 | HR 72 | Ht 67.0 in | Wt 188.8 lb

## 2022-08-25 DIAGNOSIS — Z87891 Personal history of nicotine dependence: Secondary | ICD-10-CM | POA: Diagnosis not present

## 2022-08-25 DIAGNOSIS — Z79899 Other long term (current) drug therapy: Secondary | ICD-10-CM | POA: Diagnosis not present

## 2022-08-25 DIAGNOSIS — D6869 Other thrombophilia: Secondary | ICD-10-CM | POA: Diagnosis not present

## 2022-08-25 DIAGNOSIS — I480 Paroxysmal atrial fibrillation: Secondary | ICD-10-CM

## 2022-08-25 DIAGNOSIS — Z8249 Family history of ischemic heart disease and other diseases of the circulatory system: Secondary | ICD-10-CM | POA: Diagnosis not present

## 2022-08-25 DIAGNOSIS — Z7901 Long term (current) use of anticoagulants: Secondary | ICD-10-CM | POA: Diagnosis not present

## 2022-08-25 DIAGNOSIS — I4891 Unspecified atrial fibrillation: Secondary | ICD-10-CM | POA: Diagnosis not present

## 2022-08-25 DIAGNOSIS — I10 Essential (primary) hypertension: Secondary | ICD-10-CM | POA: Diagnosis not present

## 2022-08-25 DIAGNOSIS — I2510 Atherosclerotic heart disease of native coronary artery without angina pectoris: Secondary | ICD-10-CM | POA: Insufficient documentation
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
## 2022-09-10 ENCOUNTER — Other Ambulatory Visit: Payer: Self-pay | Admitting: Cardiology

## 2022-09-10 DIAGNOSIS — D721 Eosinophilia, unspecified: Secondary | ICD-10-CM

## 2022-09-10 DIAGNOSIS — I10 Essential (primary) hypertension: Secondary | ICD-10-CM

## 2022-09-14 ENCOUNTER — Other Ambulatory Visit: Payer: Self-pay | Admitting: Internal Medicine

## 2022-09-17 DIAGNOSIS — H10501 Unspecified blepharoconjunctivitis, right eye: Secondary | ICD-10-CM | POA: Diagnosis not present

## 2022-09-17 DIAGNOSIS — H01004 Unspecified blepharitis left upper eyelid: Secondary | ICD-10-CM | POA: Diagnosis not present

## 2022-09-17 DIAGNOSIS — H01001 Unspecified blepharitis right upper eyelid: Secondary | ICD-10-CM | POA: Diagnosis not present

## 2022-10-01 ENCOUNTER — Encounter (HOSPITAL_COMMUNITY): Payer: Self-pay | Admitting: *Deleted

## 2022-10-27 ENCOUNTER — Encounter: Payer: Self-pay | Admitting: Cardiology

## 2022-10-27 ENCOUNTER — Ambulatory Visit: Payer: Medicare Other | Attending: Cardiology | Admitting: Cardiology

## 2022-10-27 VITALS — BP 114/70 | HR 66 | Ht 67.0 in | Wt 188.0 lb

## 2022-10-27 DIAGNOSIS — I480 Paroxysmal atrial fibrillation: Secondary | ICD-10-CM | POA: Diagnosis not present

## 2022-10-27 DIAGNOSIS — I10 Essential (primary) hypertension: Secondary | ICD-10-CM

## 2022-10-27 NOTE — Progress Notes (Deleted)
  Electrophysiology Office Follow up Visit Note:    Date:  10/27/2022   ID:  Jay Wilkins, DOB 18-Aug-1941, MRN [id number]  PCP:  Kathalene Frames, MD  [facility] Cardiologist:  Werner Lean, MD  [facility] Electrophysiologist:  Vickie Epley, MD    Interval History:    Jay Wilkins is a 82 y.o. male who presents for a follow up visit.    He had an A-fib ablation July 27, 2022.  During the ablation, the veins and posterior wall were isolated.  The patient saw Jay Wilkins in clinic August 25, 2022.  At that visit he was maintaining sinus rhythm.  He is on Eliquis for stroke prophylaxis.      Past medical, surgical, social and family history were reviewed.  ROS:   Please see the history of present illness.    All other systems reviewed and are negative.  EKGs/Labs/Other Studies Reviewed:    The following studies were reviewed today:    EKG:  The ekg ordered today demonstrates ***   Physical Exam:    VS:  There were no vitals taken for this visit.    Wt Readings from Last 3 Encounters:  08/25/22 188 lb 12.8 oz (85.6 kg)  07/27/22 185 lb (83.9 kg)  03/30/22 191 lb (86.6 kg)     GEN: *** Well nourished, well developed in no acute distress CARDIAC: ***RRR, no murmurs, rubs, gallops RESPIRATORY:  Clear to auscultation without rales, wheezing or rhonchi       ASSESSMENT:    1. Paroxysmal atrial fibrillation (HCC)   2. Primary hypertension    PLAN:    In order of problems listed above:   #Persistent atrial fibrillation Doing well after his catheter ablation last year.  On Eliquis for stroke prophylaxis. Continue carvedilol   #Hypertension *** goal today.  Recommend checking blood pressures 1-2 times per week at home and recording the values.  Recommend bringing these recordings to the primary care physician.   Follow-up 1 year with APP.     Signed, Lars Mage, MD, [facility], [facility] 10/27/2022 6:11 AM     Electrophysiology [facility]

## 2022-10-27 NOTE — Patient Instructions (Signed)
Medication Instructions:  Your physician recommends that you continue on your current medications as directed. Please refer to the Current Medication list given to you today.  *If you need a refill on your cardiac medications before your next appointment, please call your pharmacy*  Follow-Up: At [facility], you and your health needs are our priority.  As part of our continuing mission to provide you with exceptional heart care, we have created designated Provider Care Teams.  These Care Teams include your primary Cardiologist (physician) and Advanced Practice Providers (APPs -  Physician Assistants and Nurse Practitioners) who all work together to provide you with the care you need, when you need it.  Your next appointment:   1 year(s)  Provider:   You may see Vickie Epley, MD or one of the following Advanced Practice Providers on your designated Care Team:   Tommye Standard, [place] Beryle Beams" [place], [place], NP

## 2022-10-27 NOTE — Progress Notes (Signed)
  Electrophysiology Office Follow up Visit Note:    Date:  10/27/2022   ID:  Jay Wilkins, DOB Sep 26, 1940, MRN [id number]  PCP:  Kathalene Frames, MD  [facility] Cardiologist:  Werner Lean, MD  [facility] Electrophysiologist:  Vickie Epley, MD    Interval History:    Jay Wilkins is a 82 y.o. male who presents for a follow up visit.   He had an A-fib ablation July 27, 2022.  During the ablation, the veins and posterior wall were isolated.  The patient saw Laroy Apple in clinic August 25, 2022.  At that visit he was maintaining sinus rhythm.  He is on Eliquis for stroke prophylaxis.  Today, he is accompanied by his wife. He is feeling okay and asymptomatic.  He admits to not exercising as much as he should. He is gradually increasing his exercise. Earlier today he completed 29 minutes on his stationary bike at home. Soon he plans to return to the gym for strength training. He also tries to be active. Recently he trimmed his butterfly bushes.  His wife is concerned that his progressively worsening restless leg syndrome is related to his heart. At night he is not consciously aware of his sudden leg and torso movements.  He denies any palpitations, chest pain, shortness of breath, or peripheral edema. No lightheadedness, headaches, syncope, orthopnea, or PND.     Past medical, surgical, social and family history were reviewed.  ROS:   Please see the history of present illness.    All other systems reviewed and are negative.  EKGs/Labs/Other Studies Reviewed:    The following studies were reviewed today:   EKG:  The ekg ordered today demonstrates normal sinus rhythm   Physical Exam:    VS:  BP 114/70   Pulse 66   Ht '5\' 7"'$  (1.702 m)   Wt 188 lb (85.3 kg)   SpO2 97%   BMI 29.44 kg/m     Wt Readings from Last 3 Encounters:  10/27/22 188 lb (85.3 kg)  08/25/22 188 lb 12.8 oz (85.6 kg)  07/27/22 185 lb (83.9 kg)     GEN: Well nourished,  well developed in no acute distress CARDIAC: RRR, no murmurs, rubs, gallops RESPIRATORY:  Clear to auscultation without rales, wheezing or rhonchi       ASSESSMENT:    1. Paroxysmal atrial fibrillation (HCC)   2. Primary hypertension    PLAN:    In order of problems listed above:   #Persistent atrial fibrillation Doing well after his catheter ablation last year.  On Eliquis for stroke prophylaxis. Continue carvedilol   #Hypertension At goal today.  Recommend checking blood pressures 1-2 times per week at home and recording the values.  Recommend bringing these recordings to the primary care physician.   Follow-up 1 year with APP.  I,Mathew Stumpf,acting as a Education administrator for Vickie Epley, MD.,have documented all relevant documentation on the behalf of Vickie Epley, MD,as directed by  Vickie Epley, MD while in the presence of Vickie Epley, MD.  I, Vickie Epley, MD, have reviewed all documentation for this visit. The documentation on 10/27/22 for the exam, diagnosis, procedures, and orders are all accurate and complete.   Signed, Lars Mage, MD, [facility], [facility] 10/27/2022 4:37 PM    Electrophysiology [facility]

## 2022-11-25 DIAGNOSIS — Z85828 Personal history of other malignant neoplasm of skin: Secondary | ICD-10-CM | POA: Diagnosis not present

## 2022-11-25 DIAGNOSIS — L821 Other seborrheic keratosis: Secondary | ICD-10-CM | POA: Diagnosis not present

## 2022-12-02 DIAGNOSIS — H40013 Open angle with borderline findings, low risk, bilateral: Secondary | ICD-10-CM | POA: Diagnosis not present

## 2022-12-02 DIAGNOSIS — H0100B Unspecified blepharitis left eye, upper and lower eyelids: Secondary | ICD-10-CM | POA: Diagnosis not present

## 2022-12-02 DIAGNOSIS — H26493 Other secondary cataract, bilateral: Secondary | ICD-10-CM | POA: Diagnosis not present

## 2022-12-02 DIAGNOSIS — H524 Presbyopia: Secondary | ICD-10-CM | POA: Diagnosis not present

## 2022-12-02 DIAGNOSIS — H0100A Unspecified blepharitis right eye, upper and lower eyelids: Secondary | ICD-10-CM | POA: Diagnosis not present

## 2022-12-04 ENCOUNTER — Other Ambulatory Visit: Payer: Self-pay | Admitting: Emergency Medicine

## 2023-05-06 ENCOUNTER — Encounter: Payer: Self-pay | Admitting: Internal Medicine

## 2023-05-06 DIAGNOSIS — I480 Paroxysmal atrial fibrillation: Secondary | ICD-10-CM

## 2023-05-06 MED ORDER — APIXABAN 5 MG PO TABS: 5.0000 mg | ORAL_TABLET | Freq: Two times a day (BID) | ORAL | 1 refills | Status: DC

## 2023-05-06 NOTE — Telephone Encounter (Signed)
Prescription refill request for Eliquis received. Indication: Afib  Last office visit: 10/27/22 Lalla Brothers) Scr: 0.82 (07/01/22)  Age: 82 Weight: 85.3kg  Appropriate dose. Refill sent.  Called and spoke with pt. Made him aware refill has been sent.

## 2023-05-26 DIAGNOSIS — H40013 Open angle with borderline findings, low risk, bilateral: Secondary | ICD-10-CM | POA: Diagnosis not present

## 2023-05-28 DIAGNOSIS — Z23 Encounter for immunization: Secondary | ICD-10-CM | POA: Diagnosis not present

## 2023-06-04 DIAGNOSIS — Z23 Encounter for immunization: Secondary | ICD-10-CM | POA: Diagnosis not present

## 2023-06-16 DIAGNOSIS — L812 Freckles: Secondary | ICD-10-CM | POA: Diagnosis not present

## 2023-06-16 DIAGNOSIS — D3615 Benign neoplasm of peripheral nerves and autonomic nervous system of abdomen: Secondary | ICD-10-CM | POA: Diagnosis not present

## 2023-06-16 DIAGNOSIS — L570 Actinic keratosis: Secondary | ICD-10-CM | POA: Diagnosis not present

## 2023-06-16 DIAGNOSIS — L218 Other seborrheic dermatitis: Secondary | ICD-10-CM | POA: Diagnosis not present

## 2023-06-16 DIAGNOSIS — Z85828 Personal history of other malignant neoplasm of skin: Secondary | ICD-10-CM | POA: Diagnosis not present

## 2023-06-16 DIAGNOSIS — D2271 Melanocytic nevi of right lower limb, including hip: Secondary | ICD-10-CM | POA: Diagnosis not present

## 2023-06-16 DIAGNOSIS — L821 Other seborrheic keratosis: Secondary | ICD-10-CM | POA: Diagnosis not present

## 2023-06-16 DIAGNOSIS — D1801 Hemangioma of skin and subcutaneous tissue: Secondary | ICD-10-CM | POA: Diagnosis not present

## 2023-06-16 DIAGNOSIS — D3617 Benign neoplasm of peripheral nerves and autonomic nervous system of trunk, unspecified: Secondary | ICD-10-CM | POA: Diagnosis not present

## 2023-06-16 DIAGNOSIS — D225 Melanocytic nevi of trunk: Secondary | ICD-10-CM | POA: Diagnosis not present

## 2023-07-13 DIAGNOSIS — Z23 Encounter for immunization: Secondary | ICD-10-CM | POA: Diagnosis not present

## 2023-07-13 DIAGNOSIS — E7800 Pure hypercholesterolemia, unspecified: Secondary | ICD-10-CM | POA: Diagnosis not present

## 2023-07-13 DIAGNOSIS — I480 Paroxysmal atrial fibrillation: Secondary | ICD-10-CM | POA: Diagnosis not present

## 2023-07-13 DIAGNOSIS — D721 Eosinophilia, unspecified: Secondary | ICD-10-CM | POA: Diagnosis not present

## 2023-07-13 DIAGNOSIS — Z8546 Personal history of malignant neoplasm of prostate: Secondary | ICD-10-CM | POA: Diagnosis not present

## 2023-07-13 DIAGNOSIS — I700 Atherosclerosis of aorta: Secondary | ICD-10-CM | POA: Diagnosis not present

## 2023-07-13 DIAGNOSIS — Z0000 Encounter for general adult medical examination without abnormal findings: Secondary | ICD-10-CM | POA: Diagnosis not present

## 2023-07-13 DIAGNOSIS — Z1331 Encounter for screening for depression: Secondary | ICD-10-CM | POA: Diagnosis not present

## 2023-07-13 DIAGNOSIS — I712 Thoracic aortic aneurysm, without rupture, unspecified: Secondary | ICD-10-CM | POA: Diagnosis not present

## 2023-07-13 DIAGNOSIS — G2581 Restless legs syndrome: Secondary | ICD-10-CM | POA: Diagnosis not present

## 2023-07-13 DIAGNOSIS — I2510 Atherosclerotic heart disease of native coronary artery without angina pectoris: Secondary | ICD-10-CM | POA: Diagnosis not present

## 2023-07-13 DIAGNOSIS — Z79899 Other long term (current) drug therapy: Secondary | ICD-10-CM | POA: Diagnosis not present

## 2023-07-13 DIAGNOSIS — D7589 Other specified diseases of blood and blood-forming organs: Secondary | ICD-10-CM | POA: Diagnosis not present

## 2023-07-13 DIAGNOSIS — J449 Chronic obstructive pulmonary disease, unspecified: Secondary | ICD-10-CM | POA: Diagnosis not present

## 2023-07-13 DIAGNOSIS — I255 Ischemic cardiomyopathy: Secondary | ICD-10-CM | POA: Diagnosis not present

## 2023-09-27 ENCOUNTER — Other Ambulatory Visit: Payer: Self-pay | Admitting: Internal Medicine

## 2023-10-01 ENCOUNTER — Telehealth (HOSPITAL_BASED_OUTPATIENT_CLINIC_OR_DEPARTMENT_OTHER): Payer: Self-pay | Admitting: Pulmonary Disease

## 2023-10-01 NOTE — Telephone Encounter (Signed)
 Quillian Brunt, MD to Me      10/01/23  4:32 PM [place] patient. [phone] "rx albuterol  rescue inhaler needed today"   I called the pt and there was no answer- LMTCB   He was last seen in March 2022 and needs appt

## 2023-10-04 DIAGNOSIS — R059 Cough, unspecified: Secondary | ICD-10-CM | POA: Diagnosis not present

## 2023-10-04 DIAGNOSIS — Z03818 Encounter for observation for suspected exposure to other biological agents ruled out: Secondary | ICD-10-CM | POA: Diagnosis not present

## 2023-10-04 NOTE — Telephone Encounter (Signed)
 Routing to front desk to schedule a f/u appointment before any refills can be given.

## 2023-10-22 ENCOUNTER — Encounter: Payer: Self-pay | Admitting: Adult Health

## 2023-10-22 ENCOUNTER — Ambulatory Visit: Payer: Medicare Other | Admitting: Adult Health

## 2023-10-22 ENCOUNTER — Ambulatory Visit: Payer: Medicare Other

## 2023-10-22 VITALS — BP 157/87 | HR 96 | Temp 98.2°F | Ht 67.0 in | Wt 187.2 lb

## 2023-10-22 DIAGNOSIS — F17210 Nicotine dependence, cigarettes, uncomplicated: Secondary | ICD-10-CM

## 2023-10-22 DIAGNOSIS — J438 Other emphysema: Secondary | ICD-10-CM

## 2023-10-22 DIAGNOSIS — I700 Atherosclerosis of aorta: Secondary | ICD-10-CM | POA: Diagnosis not present

## 2023-10-22 DIAGNOSIS — R918 Other nonspecific abnormal finding of lung field: Secondary | ICD-10-CM | POA: Diagnosis not present

## 2023-10-22 DIAGNOSIS — R0602 Shortness of breath: Secondary | ICD-10-CM | POA: Diagnosis not present

## 2023-10-22 MED ORDER — STIOLTO RESPIMAT 2.5-2.5 MCG/ACT IN AERS: 2.0000 | INHALATION_SPRAY | Freq: Every day | RESPIRATORY_TRACT | 3 refills | Status: DC

## 2023-10-22 MED ORDER — ALBUTEROL SULFATE HFA 108 (90 BASE) MCG/ACT IN AERS: 1.0000 | INHALATION_SPRAY | RESPIRATORY_TRACT | 1 refills | Status: AC | PRN
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
## 2023-10-22 NOTE — Assessment & Plan Note (Signed)
 Appears stable continue on current regimen refills were sent to the pharmacy Check chest x-ray today.  Plan  Patient Instructions  Continue on Stiolto 2 puffs daily  Albuterol inhaler As needed   Activity as tolerated  Chest xray today  Follow up with Dr. Delton Coombes  in 1 year and As needed

## 2023-10-22 NOTE — Patient Instructions (Addendum)
 Continue on Stiolto 2 puffs daily  Albuterol inhaler As needed   Activity as tolerated  Chest xray today  Follow up with Dr. Delton Coombes  in 1 year and As needed

## 2023-11-04 ENCOUNTER — Encounter: Payer: Self-pay | Admitting: Internal Medicine

## 2023-11-04 DIAGNOSIS — I2510 Atherosclerotic heart disease of native coronary artery without angina pectoris: Secondary | ICD-10-CM

## 2023-11-04 DIAGNOSIS — I480 Paroxysmal atrial fibrillation: Secondary | ICD-10-CM

## 2023-11-04 DIAGNOSIS — E782 Mixed hyperlipidemia: Secondary | ICD-10-CM

## 2023-11-04 DIAGNOSIS — I10 Essential (primary) hypertension: Secondary | ICD-10-CM

## 2023-11-04 NOTE — Progress Notes (Unsigned)
  Electrophysiology Office Note:   Date:  11/05/2023  ID:  Eduardo Osier, DOB 07/16/1941, MRN [id number]  Primary Cardiologist: Christell Constant, MD Electrophysiologist: Lanier Prude, MD      History of Present Illness:   Jay Wilkins is a 83 y.o. male with h/o persistent AF s/p ablation and HTN seen today for routine electrophysiology followup.   Since last being seen in our clinic the patient reports doing well from a cardiac perspective. Had the flu and did not have any breakthrough AF. Overall, he denies chest pain, palpitations, dyspnea, PND, orthopnea, nausea, vomiting, dizziness, syncope, edema, weight gain, or early satiety.   Review of systems complete and found to be negative unless listed in HPI.   EP Information / Studies Reviewed:    EKG is ordered today. Personal review as below.  EKG Interpretation Date/Time:  Friday November 05 2023 08:51:24 EDT Ventricular Rate:  65 PR Interval:  164 QRS Duration:  76 QT Interval:  400 QTC Calculation: 416 R Axis:   -16  Text Interpretation: Normal sinus rhythm Low voltage QRS Septal infarct (cited on or before 25-Aug-2022) When compared with ECG of 25-Aug-2022 13:25, No significant change was found Confirmed by Maxine Glenn [phone]) on 11/05/2023 8:57:54 AM    Arrhythmia/Device History S/p PVI and posterior ablation 07/2022   Physical Exam:   VS:  BP (!) 150/84   Pulse 65   Ht 5\' 7"  (1.702 m)   Wt 285 lb 9.6 oz (129.5 kg)   SpO2 98%   BMI 44.73 kg/m    Wt Readings from Last 3 Encounters:  11/05/23 285 lb 9.6 oz (129.5 kg)  10/22/23 187 lb 3.2 oz (84.9 kg)  10/27/22 188 lb (85.3 kg)     GEN: No acute distress NECK: No JVD; No carotid bruits CARDIAC: Regular rate and rhythm, no murmurs, rubs, gallops RESPIRATORY:  Clear to auscultation without rales, wheezing or rhonchi  ABDOMEN: Soft, non-tender, non-distended EXTREMITIES:  No edema; No deformity   ASSESSMENT AND PLAN:    Persistent atrial fibrillation   S/p ablation 07/2022 EKG today shows NSR Continue eliquis 5 mg BID for CHA2DS2/VASc of at least 4.  Continue coreg 3.125 mg BID  HTN Stable on current regimen Move losartan to bedtime   HLD Continue statin  Labs today through [facility].   Follow up with Dr. Lalla Brothers in 12 months (or prn if Dr. Izora Ribas is willing to take over Eliquis)  Signed, Graciella Freer, PA-C

## 2023-11-05 ENCOUNTER — Ambulatory Visit: Payer: Medicare Other | Attending: Cardiology | Admitting: Student

## 2023-11-05 ENCOUNTER — Other Ambulatory Visit: Payer: Self-pay | Admitting: *Deleted

## 2023-11-05 ENCOUNTER — Encounter: Payer: Self-pay | Admitting: Student

## 2023-11-05 VITALS — BP 150/84 | HR 65 | Ht 67.0 in | Wt 285.6 lb

## 2023-11-05 DIAGNOSIS — I10 Essential (primary) hypertension: Secondary | ICD-10-CM | POA: Diagnosis not present

## 2023-11-05 DIAGNOSIS — I480 Paroxysmal atrial fibrillation: Secondary | ICD-10-CM

## 2023-11-05 DIAGNOSIS — E782 Mixed hyperlipidemia: Secondary | ICD-10-CM | POA: Diagnosis not present

## 2023-11-05 DIAGNOSIS — I2510 Atherosclerotic heart disease of native coronary artery without angina pectoris: Secondary | ICD-10-CM | POA: Diagnosis not present

## 2023-11-05 MED ORDER — LOSARTAN POTASSIUM 25 MG PO TABS: 25.0000 mg | ORAL_TABLET | Freq: Every day | ORAL | 0 refills | Status: DC

## 2023-11-05 MED ORDER — ATORVASTATIN CALCIUM 40 MG PO TABS: ORAL_TABLET | ORAL | 0 refills | Status: DC

## 2023-11-05 MED ORDER — APIXABAN 5 MG PO TABS: 5.0000 mg | ORAL_TABLET | Freq: Two times a day (BID) | ORAL | 3 refills | Status: DC

## 2023-11-05 MED ORDER — CARVEDILOL 3.125 MG PO TABS: 3.1250 mg | ORAL_TABLET | Freq: Two times a day (BID) | ORAL | 0 refills | Status: DC

## 2023-11-05 NOTE — Patient Instructions (Signed)
 Medication Instructions:  Your physician recommends that you continue on your current medications as directed. Please refer to the Current Medication list given to you today.  *If you need a refill on your cardiac medications before your next appointment, please call your pharmacy*  Lab Work: Labs today as ordered by Dr Izora Ribas If you have labs (blood work) drawn today and your tests are completely normal, you will receive your results only by: MyChart Message (if you have MyChart) OR A paper copy in the mail If you have any lab test that is abnormal or we need to change your treatment, we will call you to review the results.  Follow-Up: At [facility], you and your health needs are our priority.  As part of our continuing mission to provide you with exceptional heart care, we have created designated Provider Care Teams.  These Care Teams include your primary Cardiologist (physician) and Advanced Practice Providers (APPs -  Physician Assistants and Nurse Practitioners) who all work together to provide you with the care you need, when you need it. Your next appointment:   1 year(s)  Provider:   Steffanie Dunn, MD

## 2023-11-06 LAB — CBC WITH DIFFERENTIAL/PLATELET
Basophils Absolute: 0 10*3/uL (ref 0.0–0.2)
Basophils Absolute: 0 10*3/uL (ref 0.0–0.2)
Basos: 1 %
Basos: 1 %
EOS (ABSOLUTE): 2.9 10*3/uL — ABNORMAL HIGH (ref 0.0–0.4)
EOS (ABSOLUTE): 3 10*3/uL — ABNORMAL HIGH (ref 0.0–0.4)
Eos: 34 %
Eos: 35 %
Hematocrit: 42 % (ref 37.5–51.0)
Hematocrit: 43.1 % (ref 37.5–51.0)
Hemoglobin: 14.4 g/dL (ref 13.0–17.7)
Hemoglobin: 14.5 g/dL (ref 13.0–17.7)
Immature Grans (Abs): 0 10*3/uL (ref 0.0–0.1)
Immature Grans (Abs): 0 10*3/uL (ref 0.0–0.1)
Immature Granulocytes: 0 %
Immature Granulocytes: 0 %
Lymphocytes Absolute: 1.5 10*3/uL (ref 0.7–3.1)
Lymphocytes Absolute: 1.5 10*3/uL (ref 0.7–3.1)
Lymphs: 18 %
Lymphs: 18 %
MCH: 33.7 pg — ABNORMAL HIGH (ref 26.6–33.0)
MCH: 34.4 pg — ABNORMAL HIGH (ref 26.6–33.0)
MCHC: 33.6 g/dL (ref 31.5–35.7)
MCHC: 34.3 g/dL (ref 31.5–35.7)
MCV: 100 fL — ABNORMAL HIGH (ref 79–97)
MCV: 100 fL — ABNORMAL HIGH (ref 79–97)
Monocytes Absolute: 0.6 10*3/uL (ref 0.1–0.9)
Monocytes Absolute: 0.8 10*3/uL (ref 0.1–0.9)
Monocytes: 8 %
Monocytes: 9 %
Neutrophils Absolute: 3.3 10*3/uL (ref 1.4–7.0)
Neutrophils Absolute: 3.3 10*3/uL (ref 1.4–7.0)
Neutrophils: 38 %
Neutrophils: 38 %
Platelets: 230 10*3/uL (ref 150–450)
Platelets: 239 10*3/uL (ref 150–450)
RBC: 4.19 x10E6/uL (ref 4.14–5.80)
RBC: 4.3 x10E6/uL (ref 4.14–5.80)
RDW: 12.9 % (ref 11.6–15.4)
RDW: 13 % (ref 11.6–15.4)
WBC: 8.4 10*3/uL (ref 3.4–10.8)
WBC: 8.5 10*3/uL (ref 3.4–10.8)

## 2023-11-06 LAB — LIPID PANEL
Chol/HDL Ratio: 2.4 ratio (ref 0.0–5.0)
Chol/HDL Ratio: 2.4 ratio (ref 0.0–5.0)
Cholesterol, Total: 155 mg/dL (ref 100–199)
Cholesterol, Total: 159 mg/dL (ref 100–199)
HDL: 65 mg/dL (ref >39)
HDL: 67 mg/dL (ref >39)
LDL Chol Calc (NIH): 78 mg/dL (ref 0–99)
LDL Chol Calc (NIH): 80 mg/dL (ref 0–99)
Triglycerides: 61 mg/dL (ref 0–149)
Triglycerides: 61 mg/dL (ref 0–149)
VLDL Cholesterol Cal: 12 mg/dL (ref 5–40)
VLDL Cholesterol Cal: 12 mg/dL (ref 5–40)

## 2023-11-06 LAB — COMPREHENSIVE METABOLIC PANEL
ALT: 14 IU/L (ref 0–44)
AST: 21 IU/L (ref 0–40)
Albumin: 4 g/dL (ref 3.7–4.7)
Alkaline Phosphatase: 56 IU/L (ref 44–121)
BUN/Creatinine Ratio: 24 (ref 10–24)
BUN: 20 mg/dL (ref 8–27)
Bilirubin Total: 1.1 mg/dL (ref 0.0–1.2)
CO2: 24 mmol/L (ref 20–29)
Calcium: 9.1 mg/dL (ref 8.6–10.2)
Chloride: 101 mmol/L (ref 96–106)
Creatinine, Ser: 0.85 mg/dL (ref 0.76–1.27)
Globulin, Total: 2.2 g/dL (ref 1.5–4.5)
Glucose: 103 mg/dL — ABNORMAL HIGH (ref 70–99)
Potassium: 5 mmol/L (ref 3.5–5.2)
Sodium: 137 mmol/L (ref 134–144)
Total Protein: 6.2 g/dL (ref 6.0–8.5)
eGFR: 87 mL/min/{1.73_m2} (ref >59)

## 2023-11-15 DIAGNOSIS — L308 Other specified dermatitis: Secondary | ICD-10-CM | POA: Diagnosis not present

## 2023-11-15 DIAGNOSIS — Z85828 Personal history of other malignant neoplasm of skin: Secondary | ICD-10-CM | POA: Diagnosis not present

## 2023-11-25 DIAGNOSIS — H524 Presbyopia: Secondary | ICD-10-CM | POA: Diagnosis not present

## 2023-11-25 DIAGNOSIS — H26493 Other secondary cataract, bilateral: Secondary | ICD-10-CM | POA: Diagnosis not present

## 2023-11-25 DIAGNOSIS — H40013 Open angle with borderline findings, low risk, bilateral: Secondary | ICD-10-CM | POA: Diagnosis not present

## 2023-11-25 DIAGNOSIS — H04123 Dry eye syndrome of bilateral lacrimal glands: Secondary | ICD-10-CM | POA: Diagnosis not present

## 2023-11-25 DIAGNOSIS — H349 Unspecified retinal vascular occlusion: Secondary | ICD-10-CM | POA: Diagnosis not present

## 2023-11-25 DIAGNOSIS — H52203 Unspecified astigmatism, bilateral: Secondary | ICD-10-CM | POA: Diagnosis not present

## 2023-11-25 DIAGNOSIS — H43813 Vitreous degeneration, bilateral: Secondary | ICD-10-CM | POA: Diagnosis not present

## 2023-12-01 DIAGNOSIS — S0501XA Injury of conjunctiva and corneal abrasion without foreign body, right eye, initial encounter: Secondary | ICD-10-CM | POA: Diagnosis not present

## 2023-12-01 DIAGNOSIS — H10501 Unspecified blepharoconjunctivitis, right eye: Secondary | ICD-10-CM | POA: Diagnosis not present

## 2023-12-14 DIAGNOSIS — H26492 Other secondary cataract, left eye: Secondary | ICD-10-CM | POA: Diagnosis not present

## 2023-12-24 ENCOUNTER — Telehealth: Payer: Self-pay | Admitting: Internal Medicine

## 2023-12-24 MED ORDER — CARVEDILOL 3.125 MG PO TABS: 3.1250 mg | ORAL_TABLET | Freq: Two times a day (BID) | ORAL | 0 refills | Status: DC

## 2023-12-24 MED ORDER — ATORVASTATIN CALCIUM 40 MG PO TABS: ORAL_TABLET | ORAL | 0 refills | Status: DC

## 2023-12-24 MED ORDER — LOSARTAN POTASSIUM 25 MG PO TABS: 25.0000 mg | ORAL_TABLET | Freq: Every day | ORAL | 0 refills | Status: DC

## 2023-12-24 NOTE — Telephone Encounter (Signed)
 Pt's medications were sent to pt's pharmacy as requested. Confirmation received.

## 2023-12-24 NOTE — Telephone Encounter (Signed)
*  STAT* If patient is at the pharmacy, call can be transferred to refill team.   1. Which medications need to be refilled? (please list name of each medication and dose if known) carvedilol  (COREG ) 3.125 MG tablet  losartan  (COZAAR ) 25 MG tablet  atorvastatin  (LIPITOR) 40 MG tablet    2. Would you like to learn more about the convenience, safety, & potential cost savings by using the [facility]?    3. Are you open to using the [facility] (Type [facility].  ).   4. Which pharmacy/location (including street and city if local pharmacy) is medication to be sent to? [facility] - [place], [place] - [address]    5. Do they need a 30 day or 90 day supply? 90 day   Pt requesting refill until upcoming appt on 9/03

## 2024-01-10 ENCOUNTER — Ambulatory Visit: Payer: Medicare Other | Admitting: Internal Medicine

## 2024-02-28 ENCOUNTER — Ambulatory Visit: Attending: Internal Medicine | Admitting: Internal Medicine

## 2024-02-28 VITALS — BP 122/76 | HR 78 | Ht 67.0 in | Wt 189.0 lb

## 2024-02-28 DIAGNOSIS — Z9861 Coronary angioplasty status: Secondary | ICD-10-CM | POA: Diagnosis not present

## 2024-02-28 DIAGNOSIS — I2510 Atherosclerotic heart disease of native coronary artery without angina pectoris: Secondary | ICD-10-CM | POA: Diagnosis not present

## 2024-02-28 DIAGNOSIS — I712 Thoracic aortic aneurysm, without rupture, unspecified: Secondary | ICD-10-CM | POA: Insufficient documentation

## 2024-02-28 DIAGNOSIS — I480 Paroxysmal atrial fibrillation: Secondary | ICD-10-CM | POA: Insufficient documentation

## 2024-02-28 MED ORDER — NITROGLYCERIN 0.4 MG SL SUBL: 0.4000 mg | SUBLINGUAL_TABLET | SUBLINGUAL | 3 refills | Status: AC | PRN

## 2024-02-28 MED ORDER — LOSARTAN POTASSIUM 25 MG PO TABS: 25.0000 mg | ORAL_TABLET | Freq: Every day | ORAL | 3 refills | Status: AC

## 2024-02-28 MED ORDER — APIXABAN 5 MG PO TABS: 5.0000 mg | ORAL_TABLET | Freq: Two times a day (BID) | ORAL | 3 refills | Status: AC

## 2024-02-28 MED ORDER — ATORVASTATIN CALCIUM 40 MG PO TABS: ORAL_TABLET | ORAL | 3 refills | Status: AC

## 2024-02-28 MED ORDER — CARVEDILOL 3.125 MG PO TABS: 3.1250 mg | ORAL_TABLET | Freq: Two times a day (BID) | ORAL | 3 refills | Status: AC

## 2024-02-28 NOTE — Patient Instructions (Signed)
 Medication Instructions:  Your physician recommends that you continue on your current medications as directed. Please refer to the Current Medication list given to you today.  *If you need a refill on your cardiac medications before your next appointment, please call your pharmacy*  Lab Work: NONE  If you have labs (blood work) drawn today and your tests are completely normal, you will receive your results only by: MyChart Message (if you have MyChart) OR A paper copy in the mail If you have any lab test that is abnormal or we need to change your treatment, we will call you to review the results.  Testing/Procedures: SEPT- - - Your physician has requested that you have an echocardiogram. Echocardiography is a painless test that uses sound waves to create images of your heart. It provides your doctor with information about the size and shape of your heart and how well your heart's chambers and valves are working. This procedure takes approximately one hour. There are no restrictions for this procedure. Please do NOT wear cologne, perfume, aftershave, or lotions (deodorant is allowed). Please arrive 15 minutes prior to your appointment time.  Please note: We ask at that you not bring children with you during ultrasound (echo/ vascular) testing. Due to room size and safety concerns, children are not allowed in the ultrasound rooms during exams. Our front office staff cannot provide observation of children in our lobby area while testing is being conducted. An adult accompanying a patient to their appointment will only be allowed in the ultrasound room at the discretion of the ultrasound technician under special circumstances. We apologize for any inconvenience.   Follow-Up: At [facility], you and your health needs are our priority.  As part of our continuing mission to provide you with exceptional heart care, our providers are all part of one team.  This team includes your primary  Cardiologist (physician) and Advanced Practice Providers or APPs (Physician Assistants and Nurse Practitioners) who all work together to provide you with the care you need, when you need it.  Your next appointment:   9 month(s)  Provider:   Stanly DELENA Leavens, MD

## 2024-02-28 NOTE — Progress Notes (Signed)
 Cardiology Office Note:  .    Date:  02/28/2024  ID:  Jay Wilkins, DOB July 25, 1941, MRN [id number] PCP: Charlott Dorn DELENA, MD  [facility] Providers Cardiologist:  Stanly DELENA Leavens, MD Electrophysiologist:  OLE ONEIDA HOLTS, MD     CC: Follow up CAD/HF  History of Present Illness: .    Jay Wilkins is a 83 y.o. male with coronary artery disease and atrial fibrillation who presents for follow-up of his cardiac conditions.  He has a history of coronary artery disease, status post LAD stenting, and distal RCA with collateral flow. He has not experienced any coronary symptoms and has not needed to use nitroglycerin . His cholesterol levels have fluctuated, with the most recent LDL being 155 mg/dL, slightly above target. He has been managing his cholesterol through lifestyle changes, including cycling, which previously helped reduce his levels.  He was diagnosed with paroxysmal atrial fibrillation and underwent ablation. Since the procedure, he has not experienced any symptoms of atrial fibrillation. He is currently on Eliquis  for anticoagulation.  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 He is currently stable with no recent exacerbations. He also has a mild thoracic aortic dilation noted in 2023, which is being monitored, and a history of mild mitral regurgitation noted on an echocardiogram performed in 2022.  He recently experienced muscle spasms for which he started taking cyclobenzaprine  last Friday. He reports improvement and plans to stop the medication soon.  He plans to travel to [facility], [place] , for family events, including a baby shower and a beach trip. He has a stationary bike at home and plans to resume cycling to help manage his cholesterol.  Discussed the use of AI scribe software for clinical note transcription with the patient, who gave verbal consent to proceed.   Relevant histories: .   Social  - Living Situation: Lives with wife who has mobility issues due to knee replacements and back problems. - Has a son living in [place], [place] , and a granddaughter who is pregnant. Plans to travel to visit family and attend a baby shower and a wedding. Also plans to go to the beach for a couple of weeks. 2017: PN patient with PCI of LAD 2022: Established with me 2023: Mild aortic dilation and new AF 2024: s/p ablation no AF ROS: As per HPI.   Studies Reviewed: .     Cardiac Studies & Procedures   ______________________________________________________________________________________________ CARDIAC CATHETERIZATION  CARDIAC CATHETERIZATION 10/10/2015  Conclusion  Prox RCA to Mid RCA lesion, 25% stenosed.  RPDA lesion, 100% stenosed.  Ost Cx to Mid Cx lesion, 10% stenosed.  There is mild left ventricular systolic dysfunction.  Prox LAD lesion, 85% stenosed. Post intervention, there is a 0% residual stenosis.  1. 2 vessel obstructive CAD. - 85% proximal LAD - focal - 100% mid PDA 2. Mild LV dysfunction 3. Successful stenting of the proximal LAD with a DES.  Plan: risk factor modification. He will be considered for the TWILIGHT study. If he chooses not to do this then continue ASA and Plavix  for one year. Anticipate DC in am.  Findings Coronary Findings Diagnostic  Dominance: Right  Left Anterior Descending Moderately Calcified discrete.  Left Circumflex Diffuse.  Right Coronary Artery Moderately Calcified diffuse.  Right Posterior Descending Artery Collaterals RPDA filled by collaterals from Dist LAD.  Intervention  Prox LAD lesion PCI The pre-interventional distal flow is normal (TIMI 3). 2.5 mm balloon A drug-eluting stent was placed.  Post-stent angioplasty was performed. 3.25 mm [place] balloon Maximum pressure: 16 atm. The post-interventional distal flow is normal (TIMI 3). The intervention was successful. No complications occurred at this  lesion. Supplies used: STENT PROMUS PREM MR 3.0X12 There is a 0% residual stenosis post intervention.   STRESS TESTS  MYOCARDIAL PERFUSION IMAGING 09/03/2015  Interpretation Summary  Nuclear stress EF: 43%.  There was no ST segment deviation noted during stress.  The left ventricular ejection fraction is moderately decreased (30-44%).  This is an intermediate risk study.  Findings consistent with prior myocardial infarction.  1. Large fixed perfusion defect involving the basal to mid inferior and inferoseptal walls as well as the true apex.  This suggests prior infarction with minimal ischemia. 2. EF 43% with inferior/inferoseptal hypokinesis. 3. Intermediate risk study.   ECHOCARDIOGRAM  ECHOCARDIOGRAM COMPLETE 10/22/2020  Narrative ECHOCARDIOGRAM REPORT    Patient Name:   Jay Wilkins Date of Exam: 10/22/2020 Medical Rec #:  [id number]     Height:       67.0 in Accession #:    [id number]    Weight:       173.0 lb Date of Birth:  1941/07/21     BSA:          1.901 m Patient Age:    79 years      BP:           163/97 mmHg Patient Gender: M             HR:           68 bpm. Exam Location:  [address]  Procedure: 2D Echo, 3D Echo, Cardiac Doppler and Color Doppler  Indications:    I25.10 CAD  History:        Patient has no prior history of Echocardiogram examinations. Ischemic cardiomyopathy, COPD; Risk Factors:Hypertension and HLD.  Sonographer:    Waldo Guadalajara RCS Referring Phys: [id number] Stephany Poorman A Rodman Recupero  IMPRESSIONS   1. Hypokinesis of the basal to mid inferior and inferoseptal myocardium. Left ventricular ejection fraction, by estimation, is 40 to 45%. The left ventricle has mildly decreased function. The left ventricle demonstrates regional wall motion abnormalities (see scoring diagram/findings for description). There is mild left ventricular hypertrophy of the posterior segment. Left ventricular diastolic parameters are consistent with Grade I  diastolic dysfunction (impaired relaxation). Elevated left ventricular end-diastolic pressure. 2. Right ventricular systolic function is normal. The right ventricular size is normal. There is normal pulmonary artery systolic pressure. 3. The mitral valve is normal in structure. Trivial mitral valve regurgitation. No evidence of mitral stenosis. 4. Calcification of the aortic valve, primarily the noncoronary cusp. The aortic valve is calcified. There is moderate calcification of the aortic valve. There is moderate thickening of the aortic valve. Aortic valve regurgitation is not visualized. No aortic stenosis is present. 5. Aortic dilatation noted. There is mild dilatation of the ascending aorta, measuring 41 mm. 6. The inferior vena cava is normal in size with greater than 50% respiratory variability, suggesting right atrial pressure of 3 mmHg.  FINDINGS Left Ventricle: Hypokinesis of the basal to mid inferior and inferoseptal myocardium. Left ventricular ejection fraction, by estimation, is 40 to 45%. The left ventricle has mildly decreased function. The left ventricle demonstrates regional wall motion abnormalities. The left ventricular internal cavity size was normal in size. There is mild left ventricular hypertrophy of the posterior segment. Left ventricular diastolic parameters are consistent with Grade I diastolic dysfunction (impaired relaxation). Elevated left ventricular end-diastolic pressure.  Right Ventricle: The right ventricular size is normal. No increase in right ventricular wall thickness. Right ventricular systolic function is normal. There is normal pulmonary artery systolic pressure. The tricuspid regurgitant velocity is 2.59 m/s, and with an assumed right atrial pressure of 3 mmHg, the estimated right ventricular systolic pressure is 29.8 mmHg.  Left Atrium: Left atrial size was normal in size.  Right Atrium: Right atrial size was normal in size.  Pericardium: There is no  evidence of pericardial effusion.  Mitral Valve: The mitral valve is normal in structure. Trivial mitral valve regurgitation. No evidence of mitral valve stenosis.  Tricuspid Valve: The tricuspid valve is normal in structure. Tricuspid valve regurgitation is trivial. No evidence of tricuspid stenosis.  Aortic Valve: Calcification of the aortic valve, primarily the noncoronary cusp. The aortic valve is calcified. There is moderate calcification of the aortic valve. There is moderate thickening of the aortic valve. Aortic valve regurgitation is not visualized. No aortic stenosis is present.  Pulmonic Valve: The pulmonic valve was normal in structure. Pulmonic valve regurgitation is trivial. No evidence of pulmonic stenosis.  Aorta: Aortic dilatation noted. There is mild dilatation of the ascending aorta, measuring 41 mm.  Venous: The inferior vena cava is normal in size with greater than 50% respiratory variability, suggesting right atrial pressure of 3 mmHg.  IAS/Shunts: No atrial level shunt detected by color flow Doppler.   LEFT VENTRICLE PLAX 2D LVIDd:         5.00 cm  Diastology LVIDs:         4.00 cm  LV e' medial:    4.79 cm/s LV PW:         1.20 cm  LV E/e' medial:  15.5 LV IVS:        0.90 cm  LV e' lateral:   5.87 cm/s LVOT diam:     2.30 cm  LV E/e' lateral: 12.6 LV SV:         87 LV SV Index:   46 LVOT Area:     4.15 cm  3D Volume EF: 3D EF:        53 % LV EDV:       109 ml LV ESV:       51 ml LV SV:        58 ml  RIGHT VENTRICLE RV Basal diam:  3.90 cm RV S prime:     15.20 cm/s TAPSE (M-mode): 2.7 cm RVSP:           29.8 mmHg  LEFT ATRIUM             Index       RIGHT ATRIUM           Index LA diam:        4.30 cm 2.26 cm/m  RA Pressure: 3.00 mmHg LA Vol (A2C):   36.7 ml 19.30 ml/m RA Area:     14.30 cm LA Vol (A4C):   46.6 ml 24.48 ml/m RA Volume:   33.60 ml  17.67 ml/m LA Biplane Vol: 44.4 ml 23.35 ml/m AORTIC VALVE LVOT Vmax:   80.30 cm/s LVOT  Vmean:  59.300 cm/s LVOT VTI:    0.209 m  AORTA Ao Root diam: 3.40 cm Ao Asc diam:  4.10 cm  MITRAL VALVE                 TRICUSPID VALVE MV Area (PHT):  TR Peak grad:   26.8 mmHg MV Decel Time:               TR Vmax:        259.00 cm/s MR Peak grad:    149.8 mmHg  Estimated RAP:  3.00 mmHg MR Mean grad:    101.0 mmHg  RVSP:           29.8 mmHg MR Vmax:         612.00 cm/s MR Vmean:        478.0 cm/s  SHUNTS MR PISA:         1.57 cm    Systemic VTI:  0.21 m MR PISA Eff ROA: 8 mm       Systemic Diam: 2.30 cm MR PISA Radius:  0.50 cm MV E velocity: 74.10 cm/s MV A velocity: 80.60 cm/s MV E/A ratio:  0.92  Annabella Scarce MD Electronically signed by Annabella Scarce MD Signature Date/Time: 10/22/2020/3:55:30 PM    Final    MONITORS  LONG TERM MONITOR (3-14 DAYS) 01/15/2022  Narrative  Patient had a minimum heart rate of 51 bpm, maximum heart rate of 124 bpm (AF RVR), and average heart rate of 74 bpm.  Predominant underlying rhythm was sinus rhythm.  Atrial fibrillation noted (26% burden). Average rate 77 bpm. Longest episode 3 days, 4 hours.  Isolated PACs were rare (<1.0%).  Isolated PVCs were rare (<1.0%).  Triggered and diary events associated with sinus rhythm and atrial fibrillation.  Frequent, possibly symptomatic rate controlled paroxysmal atrial fibrillation.       ______________________________________________________________________________________________       Physical Exam:    VS:  BP 122/76 (BP Location: Right Arm)   Pulse 78   Ht 5' 7 (1.702 m)   Wt 189 lb (85.7 kg)   SpO2 97%   BMI 29.60 kg/m    Wt Readings from Last 3 Encounters:  02/28/24 189 lb (85.7 kg)  11/05/23 285 lb 9.6 oz (129.5 kg)  10/22/23 187 lb 3.2 oz (84.9 kg)    Gen: no distress   Neck: No JVD Cardiac: No Rubs or Gallops, no murmur, RRR +2 radial pulses Respiratory: Clear to auscultation bilaterally, normal effort, normal  respiratory rate GI: Soft,  nontender, non-distended  MS: No  edema;  moves all extremities Integument: Skin feels warm Neuro:  At time of evaluation, alert and oriented to person/place/time/situation  Psych: Normal affect, patient feels ok   ASSESSMENT AND PLAN: .    Coronary artery disease History of coronary artery disease with status post LAD stenting and distal RCA with collateral flow. No recent coronary symptoms reported. LDL levels have been slightly above target but have shown improvement with lifestyle changes. - Continue current statin therapy and current therapy - Encourage lifestyle modifications, including cycling which in 2023 dropped his LDL level to goal, to manage LDL levels  Heart failure with reduced ejection fraction - NYHA I, Euvolemic, last assess in 2022 with mild aortic dilation. Chronic heart failure with reduced ejection fraction, ischemic cardiomyopathy. No recent exacerbations reported. Previous echocardiogram showed mild mitral regurgitation. - Order echocardiogram in September to assess heart function and mitral regurgitation - no change in therapy at this time  Atrial fibrillation, Persistent Atrial fibrillation, previously managed with ablation. No recent symptoms or episodes reported. - continue AC; can now come from me no CL  Hyperlipidemia LDL levels have been slightly above target but have shown improvement with lifestyle changes. Current LDL is 155 mg/dL, down from 839  mg/dL a year ago. He prefers lifestyle modifications over increasing medication dosage. - Continue current statin therapy - Encourage lifestyle modifications, including cycling, to manage LDL levels - Consider increasing atorvastatin  to 80 mg if LDL levels remain elevated  Hypertension Blood pressure is well-controlled with current medication regimen.  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 - Order echocardiogram in September to assess aortic dilation (after he becomes a great  grandfather)   Stanly Leavens, MD FASE [facility] Cardiologist [facility]  [address] [place], [address] [place], [place] [address] [phone]  4:48 PM

## 2024-03-27 DIAGNOSIS — L821 Other seborrheic keratosis: Secondary | ICD-10-CM | POA: Diagnosis not present

## 2024-03-27 DIAGNOSIS — Z85828 Personal history of other malignant neoplasm of skin: Secondary | ICD-10-CM | POA: Diagnosis not present

## 2024-04-10 DIAGNOSIS — R059 Cough, unspecified: Secondary | ICD-10-CM | POA: Diagnosis not present

## 2024-04-10 DIAGNOSIS — Z03818 Encounter for observation for suspected exposure to other biological agents ruled out: Secondary | ICD-10-CM | POA: Diagnosis not present

## 2024-04-26 ENCOUNTER — Ambulatory Visit: Admitting: Internal Medicine

## 2024-05-01 ENCOUNTER — Ambulatory Visit (HOSPITAL_COMMUNITY)
Admission: RE | Admit: 2024-05-01 | Discharge: 2024-05-01 | Disposition: A | Discharge status: Home / Self Care | Source: Ambulatory Visit | Attending: Internal Medicine | Admitting: Internal Medicine

## 2024-05-01 DIAGNOSIS — I2510 Atherosclerotic heart disease of native coronary artery without angina pectoris: Secondary | ICD-10-CM | POA: Diagnosis not present

## 2024-05-01 DIAGNOSIS — Z9861 Coronary angioplasty status: Secondary | ICD-10-CM | POA: Insufficient documentation

## 2024-05-01 DIAGNOSIS — I712 Thoracic aortic aneurysm, without rupture, unspecified: Secondary | ICD-10-CM | POA: Insufficient documentation

## 2024-05-01 LAB — ECHOCARDIOGRAM COMPLETE: S' Lateral: 3.84 cm

## 2024-05-08 ENCOUNTER — Ambulatory Visit: Payer: Self-pay

## 2024-05-12 DIAGNOSIS — Z23 Encounter for immunization: Secondary | ICD-10-CM | POA: Diagnosis not present

## 2024-05-19 DIAGNOSIS — Z23 Encounter for immunization: Secondary | ICD-10-CM | POA: Diagnosis not present

## 2024-05-20 DIAGNOSIS — H10502 Unspecified blepharoconjunctivitis, left eye: Secondary | ICD-10-CM | POA: Diagnosis not present

## 2024-06-21 DIAGNOSIS — Z85828 Personal history of other malignant neoplasm of skin: Secondary | ICD-10-CM | POA: Diagnosis not present

## 2024-06-21 DIAGNOSIS — L218 Other seborrheic dermatitis: Secondary | ICD-10-CM | POA: Diagnosis not present

## 2024-06-21 DIAGNOSIS — D225 Melanocytic nevi of trunk: Secondary | ICD-10-CM | POA: Diagnosis not present

## 2024-06-21 DIAGNOSIS — D3617 Benign neoplasm of peripheral nerves and autonomic nervous system of trunk, unspecified: Secondary | ICD-10-CM | POA: Diagnosis not present

## 2024-06-21 DIAGNOSIS — L821 Other seborrheic keratosis: Secondary | ICD-10-CM | POA: Diagnosis not present

## 2024-06-21 DIAGNOSIS — L812 Freckles: Secondary | ICD-10-CM | POA: Diagnosis not present

## 2024-06-21 DIAGNOSIS — D485 Neoplasm of uncertain behavior of skin: Secondary | ICD-10-CM | POA: Diagnosis not present

## 2024-06-21 DIAGNOSIS — D2271 Melanocytic nevi of right lower limb, including hip: Secondary | ICD-10-CM | POA: Diagnosis not present

## 2024-06-21 DIAGNOSIS — L820 Inflamed seborrheic keratosis: Secondary | ICD-10-CM | POA: Diagnosis not present

## 2024-06-22 ENCOUNTER — Other Ambulatory Visit: Payer: Self-pay | Admitting: Adult Health

## 2024-06-28 DIAGNOSIS — H40023 Open angle with borderline findings, high risk, bilateral: Secondary | ICD-10-CM | POA: Diagnosis not present

## 2024-07-18 DIAGNOSIS — I480 Paroxysmal atrial fibrillation: Secondary | ICD-10-CM | POA: Diagnosis not present

## 2024-07-18 DIAGNOSIS — R0989 Other specified symptoms and signs involving the circulatory and respiratory systems: Secondary | ICD-10-CM | POA: Diagnosis not present

## 2024-07-18 DIAGNOSIS — D721 Eosinophilia, unspecified: Secondary | ICD-10-CM | POA: Diagnosis not present

## 2024-07-18 DIAGNOSIS — I255 Ischemic cardiomyopathy: Secondary | ICD-10-CM | POA: Diagnosis not present

## 2024-07-18 DIAGNOSIS — Z0000 Encounter for general adult medical examination without abnormal findings: Secondary | ICD-10-CM | POA: Diagnosis not present

## 2024-07-18 DIAGNOSIS — I700 Atherosclerosis of aorta: Secondary | ICD-10-CM | POA: Diagnosis not present

## 2024-07-18 DIAGNOSIS — Z8546 Personal history of malignant neoplasm of prostate: Secondary | ICD-10-CM | POA: Diagnosis not present

## 2024-07-18 DIAGNOSIS — I712 Thoracic aortic aneurysm, without rupture, unspecified: Secondary | ICD-10-CM | POA: Diagnosis not present

## 2024-07-18 DIAGNOSIS — D7589 Other specified diseases of blood and blood-forming organs: Secondary | ICD-10-CM | POA: Diagnosis not present

## 2024-07-18 DIAGNOSIS — I2510 Atherosclerotic heart disease of native coronary artery without angina pectoris: Secondary | ICD-10-CM | POA: Diagnosis not present

## 2024-07-18 DIAGNOSIS — Z1331 Encounter for screening for depression: Secondary | ICD-10-CM | POA: Diagnosis not present

## 2024-07-18 DIAGNOSIS — Z03818 Encounter for observation for suspected exposure to other biological agents ruled out: Secondary | ICD-10-CM | POA: Diagnosis not present

## 2024-07-18 DIAGNOSIS — I5022 Chronic systolic (congestive) heart failure: Secondary | ICD-10-CM | POA: Diagnosis not present

## 2024-07-18 DIAGNOSIS — J449 Chronic obstructive pulmonary disease, unspecified: Secondary | ICD-10-CM | POA: Diagnosis not present

## 2024-07-18 DIAGNOSIS — E7800 Pure hypercholesterolemia, unspecified: Secondary | ICD-10-CM | POA: Diagnosis not present

## 2024-07-18 DIAGNOSIS — K219 Gastro-esophageal reflux disease without esophagitis: Secondary | ICD-10-CM | POA: Diagnosis not present

## 2024-07-26 DIAGNOSIS — J441 Chronic obstructive pulmonary disease with (acute) exacerbation: Secondary | ICD-10-CM | POA: Diagnosis not present
